# Patient Record
Sex: Male | Born: 1970 | Race: White | Hispanic: No | Marital: Married | Smoking: Former smoker
Health system: Southern US, Community
[De-identification: ages and names within clinical notes are randomized; demographics above are authoritative.]

## PROBLEM LIST (undated history)

## (undated) DIAGNOSIS — C4490 Unspecified malignant neoplasm of skin, unspecified: Secondary | ICD-10-CM

## (undated) DIAGNOSIS — Z9889 Other specified postprocedural states: Secondary | ICD-10-CM

## (undated) DIAGNOSIS — H9191 Unspecified hearing loss, right ear: Secondary | ICD-10-CM

## (undated) DIAGNOSIS — E7800 Pure hypercholesterolemia, unspecified: Secondary | ICD-10-CM

## (undated) DIAGNOSIS — R112 Nausea with vomiting, unspecified: Secondary | ICD-10-CM

## (undated) DIAGNOSIS — S82009A Unspecified fracture of unspecified patella, initial encounter for closed fracture: Secondary | ICD-10-CM

## (undated) DIAGNOSIS — K603 Anal fistula, unspecified: Secondary | ICD-10-CM

## (undated) DIAGNOSIS — Z8719 Personal history of other diseases of the digestive system: Secondary | ICD-10-CM

## (undated) DIAGNOSIS — N200 Calculus of kidney: Secondary | ICD-10-CM

## (undated) DIAGNOSIS — Z87898 Personal history of other specified conditions: Secondary | ICD-10-CM

## (undated) DIAGNOSIS — K219 Gastro-esophageal reflux disease without esophagitis: Secondary | ICD-10-CM

## (undated) DIAGNOSIS — K200 Eosinophilic esophagitis: Secondary | ICD-10-CM

## (undated) HISTORY — DX: Eosinophilic esophagitis: K20.0

## (undated) HISTORY — DX: Personal history of other diseases of the digestive system: Z87.19

## (undated) HISTORY — PX: OTHER SURGICAL HISTORY: SHX169

## (undated) HISTORY — PX: COLONOSCOPY: SHX174

## (undated) HISTORY — DX: Unspecified malignant neoplasm of skin, unspecified: C44.90

## (undated) HISTORY — PX: SHOULDER ARTHROSCOPY: SHX128

## (undated) HISTORY — DX: Pure hypercholesterolemia, unspecified: E78.00

## (undated) HISTORY — PX: TONSILLECTOMY: SUR1361

## (undated) HISTORY — PX: SHOULDER ARTHROTOMY: SUR111

## (undated) HISTORY — PX: LITHOTRIPSY: SUR834

---

## 2000-05-08 ENCOUNTER — Emergency Department (HOSPITAL_COMMUNITY): Admission: EM | Admit: 2000-05-08 | Discharge: 2000-05-08 | Payer: Self-pay | Admitting: Emergency Medicine

## 2000-05-08 ENCOUNTER — Encounter: Payer: Self-pay | Admitting: Emergency Medicine

## 2001-05-07 ENCOUNTER — Encounter: Admission: RE | Admit: 2001-05-07 | Discharge: 2001-05-07 | Payer: Self-pay | Admitting: Family Medicine

## 2001-05-07 ENCOUNTER — Encounter: Payer: Self-pay | Admitting: Family Medicine

## 2001-05-18 ENCOUNTER — Ambulatory Visit (HOSPITAL_COMMUNITY): Admission: RE | Admit: 2001-05-18 | Discharge: 2001-05-18 | Payer: Self-pay | Admitting: Gastroenterology

## 2001-05-18 ENCOUNTER — Encounter: Payer: Self-pay | Admitting: Gastroenterology

## 2004-01-12 ENCOUNTER — Emergency Department (HOSPITAL_COMMUNITY): Admission: EM | Admit: 2004-01-12 | Discharge: 2004-01-12 | Payer: Self-pay | Admitting: Emergency Medicine

## 2004-09-09 ENCOUNTER — Emergency Department (HOSPITAL_COMMUNITY): Admission: EM | Admit: 2004-09-09 | Discharge: 2004-09-09 | Payer: Self-pay | Admitting: Family Medicine

## 2005-09-15 ENCOUNTER — Emergency Department (HOSPITAL_COMMUNITY): Admission: EM | Admit: 2005-09-15 | Discharge: 2005-09-15 | Payer: Self-pay | Admitting: Family Medicine

## 2005-09-26 ENCOUNTER — Emergency Department (HOSPITAL_COMMUNITY): Admission: EM | Admit: 2005-09-26 | Discharge: 2005-09-26 | Payer: Self-pay | Admitting: Emergency Medicine

## 2005-10-21 ENCOUNTER — Emergency Department (HOSPITAL_COMMUNITY): Admission: EM | Admit: 2005-10-21 | Discharge: 2005-10-21 | Payer: Self-pay | Admitting: Family Medicine

## 2006-04-12 HISTORY — PX: MIDDLE EAR SURGERY: SHX713

## 2010-06-19 ENCOUNTER — Inpatient Hospital Stay (INDEPENDENT_AMBULATORY_CARE_PROVIDER_SITE_OTHER)
Admission: RE | Admit: 2010-06-19 | Discharge: 2010-06-19 | Disposition: A | Discharge status: Home / Self Care | Payer: BC Managed Care – PPO | Source: Ambulatory Visit | Attending: Emergency Medicine | Admitting: Emergency Medicine

## 2010-06-19 ENCOUNTER — Ambulatory Visit (HOSPITAL_COMMUNITY)
Admission: RE | Admit: 2010-06-19 | Discharge: 2010-06-19 | Disposition: A | Discharge status: Home / Self Care | Payer: BC Managed Care – PPO | Source: Ambulatory Visit | Attending: Family Medicine | Admitting: Family Medicine

## 2010-06-19 DIAGNOSIS — M25569 Pain in unspecified knee: Secondary | ICD-10-CM | POA: Insufficient documentation

## 2010-06-19 DIAGNOSIS — R609 Edema, unspecified: Secondary | ICD-10-CM | POA: Insufficient documentation

## 2010-06-19 DIAGNOSIS — M76899 Other specified enthesopathies of unspecified lower limb, excluding foot: Secondary | ICD-10-CM

## 2010-06-19 LAB — DIFFERENTIAL
Basophils Absolute: 0 10*3/uL (ref 0.0–0.1)
Basophils Relative: 0 % (ref 0–1)
Eosinophils Absolute: 0.5 10*3/uL (ref 0.0–0.7)
Eosinophils Relative: 5 % (ref 0–5)
Lymphocytes Relative: 30 % (ref 12–46)
Lymphs Abs: 2.7 10*3/uL (ref 0.7–4.0)
Monocytes Absolute: 0.9 10*3/uL (ref 0.1–1.0)
Monocytes Relative: 11 % (ref 3–12)
Neutro Abs: 4.9 10*3/uL (ref 1.7–7.7)
Neutrophils Relative %: 54 % (ref 43–77)

## 2010-06-19 LAB — CBC
Hemoglobin: 14.8 g/dL (ref 13.0–17.0)
MCHC: 34.7 g/dL (ref 30.0–36.0)
Platelets: 217 10*3/uL (ref 150–400)
RDW: 13.2 % (ref 11.5–15.5)

## 2011-07-04 ENCOUNTER — Encounter (HOSPITAL_COMMUNITY): Payer: Self-pay | Admitting: Pharmacy Technician

## 2011-07-11 ENCOUNTER — Encounter (HOSPITAL_COMMUNITY)
Admission: RE | Admit: 2011-07-11 | Discharge: 2011-07-11 | Disposition: A | Discharge status: Home / Self Care | Payer: BC Managed Care – PPO | Source: Ambulatory Visit | Attending: Otolaryngology | Admitting: Otolaryngology

## 2011-07-11 ENCOUNTER — Encounter (HOSPITAL_COMMUNITY): Payer: Self-pay

## 2011-07-11 HISTORY — DX: Nausea with vomiting, unspecified: R11.2

## 2011-07-11 HISTORY — DX: Other specified postprocedural states: Z98.890

## 2011-07-11 HISTORY — DX: Gastro-esophageal reflux disease without esophagitis: K21.9

## 2011-07-11 LAB — CBC
MCV: 91.2 fL (ref 78.0–100.0)
Platelets: 239 10*3/uL (ref 150–400)
RBC: 4.87 MIL/uL (ref 4.22–5.81)
WBC: 8.4 10*3/uL (ref 4.0–10.5)

## 2011-07-11 LAB — APTT: aPTT: 27 seconds (ref 24–37)

## 2011-07-11 LAB — DIFFERENTIAL
Basophils Absolute: 0 10*3/uL (ref 0.0–0.1)
Lymphocytes Relative: 29 % (ref 12–46)
Lymphs Abs: 2.5 10*3/uL (ref 0.7–4.0)
Neutro Abs: 4.7 10*3/uL (ref 1.7–7.7)
Neutrophils Relative %: 56 % (ref 43–77)

## 2011-07-11 LAB — SURGICAL PCR SCREEN
MRSA, PCR: NEGATIVE
Staphylococcus aureus: NEGATIVE

## 2011-07-11 LAB — PROTIME-INR
INR: 0.98 (ref 0.00–1.49)
Prothrombin Time: 13.2 seconds (ref 11.6–15.2)

## 2011-07-11 NOTE — Pre-Procedure Instructions (Signed)
20 Dean Hoover  07/11/2011   Your procedure is scheduled on:  March 7  Report to [facility] at 0730 AM.  Call this number if you have problems the morning of surgery: [phone]   Remember:   Do not eat food:After Midnight.  May have clear liquids: up to 4 Hours before arrival.330am  Clear liquids include soda, tea, black coffee, apple or grape juice, broth.  Take these medicines the morning of surgery with A SIP OF WATER: none   Do not wear jewelry, make-up or nail polish.  Do not wear lotions, powders, or perfumes. You may wear deodorant.  Do not shave 48 hours prior to surgery.  Do not bring valuables to the hospital.  Contacts, dentures or bridgework may not be worn into surgery.  Leave suitcase in the car. After surgery it may be brought to your room.  For patients admitted to the hospital, checkout time is 11:00 AM the day of discharge.   Patients discharged the day of surgery will not be allowed to drive home.  Name and phone number of your driver: family  Special Instructions: CHG Shower Use Special Wash: 1/2 bottle night before surgery and 1/2 bottle morning of surgery.   Please read over the following fact sheets that you were given: MRSA Information and Surgical Site Infection Prevention

## 2011-07-13 MED ORDER — CEFAZOLIN SODIUM-DEXTROSE 2-3 GM-% IV SOLR
2.0000 g | Freq: Once | INTRAVENOUS | Status: AC
  Administered 2011-07-14: 2 g via INTRAVENOUS
  Filled 2011-07-13: qty 50

## 2011-07-13 NOTE — H&P (Signed)
Dean Hoover is an 41 y.o. male.   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 PONV (postoperative nausea and vomiting)   . GERD (gastroesophageal reflux disease)     occ    Past Surgical History  Procedure Date  . Middle ear surgery 12,05,07    tumor removed, prosthestic hearing bones insert,  . Shoulder arthrotomy     torn lig lft  . Shoulder arthroscopy     torn cuff lft  . Tonsillectomy     41 yrs old    No family history on file. Social History:  reports that he has quit smoking. He does not have any smokeless tobacco history on file. He reports that he drinks about 3 ounces of alcohol per week. His drug history not on file.  Allergies:  Allergies  Allergen Reactions  . Fish Allergy Anaphylaxis    Throat swells  . Poultry Meal Anaphylaxis    Throat swells  . Tree Extract Hives  . Codeine Other (See Comments)    unknown    No current facility-administered medications on file as of .   No current outpatient prescriptions on file as of .    Results for orders placed during the hospital encounter of 07/11/11 (from the past 48 hour(s))  CBC     Status: Normal   Collection Time   07/11/11  2:26 PM      Component Value Range Comment   WBC 8.4  4.0 - 10.5 (K/uL)    RBC 4.87  4.22 - 5.81 (MIL/uL)    Hemoglobin 15.1  13.0 - 17.0 (g/dL)    HCT 16.1  09.6 - 04.5 (%)    MCV 91.2  78.0 - 100.0 (fL)    MCH 31.0  26.0 - 34.0 (pg)    MCHC 34.0  30.0 - 36.0 (g/dL)    RDW 40.9  81.1 - 91.4 (%)    Platelets 239  150 - 400 (K/uL)   DIFFERENTIAL     Status: Normal   Collection Time   07/11/11  2:26 PM      Component Value Range Comment   Neutrophils Relative 56  43 - 77 (%)    Neutro Abs 4.7  1.7 - 7.7 (K/uL)    Lymphocytes Relative 29  12 - 46 (%)    Lymphs Abs 2.5  0.7 - 4.0 (K/uL)    Monocytes Relative 11  3 - 12 (%)    Monocytes Absolute  0.9  0.1 - 1.0 (K/uL)    Eosinophils Relative 3  0 - 5 (%)    Eosinophils Absolute 0.3  0.0 - 0.7 (K/uL)    Basophils Relative 0  0 - 1 (%)    Basophils Absolute 0.0  0.0 - 0.1 (K/uL)   APTT     Status: Normal   Collection Time   07/11/11  2:26 PM      Component Value Range Comment   aPTT 27  24 - 37 (seconds)   PROTIME-INR     Status: Normal   Collection Time   07/11/11  2:26 PM      Component Value Range Comment   Prothrombin Time 13.2  11.6 - 15.2 (seconds)    INR 0.98  0.00 - 1.49    SURGICAL PCR SCREEN     Status: Normal   Collection Time   07/11/11  2:26 PM  Component Value Range Comment   MRSA, PCR NEGATIVE  NEGATIVE     Staphylococcus aureus NEGATIVE  NEGATIVE     No results found.  Review of Systems  Constitutional: Negative.   HENT: Positive for hearing loss.   Eyes: Negative.   Respiratory: Negative.   Cardiovascular: Negative.   Gastrointestinal: Negative.   Genitourinary: Negative.   Musculoskeletal: Negative.   Skin: Negative.   Neurological: Negative.   Endo/Heme/Allergies: Negative.   Psychiatric/Behavioral: Negative.     There were no vitals taken for this visit. Physical Exam  Constitutional: Vital signs are normal. He appears well-developed.  HENT:  Head: Normocephalic.  Right Ear: Decreased hearing is noted.  Left Ear: Hearing normal.  Nose: Nose normal.  Mouth/Throat: Uvula is midline, oropharynx is clear and moist and mucous membranes are normal.  Eyes: Pupils are equal, round, and reactive to light.  Neck: Normal range of motion. Neck supple.  Cardiovascular: Normal rate.   Respiratory: Effort normal and breath sounds normal.  GI: Soft.  Musculoskeletal: Normal range of motion.  Neurological: He is alert.  Skin: Skin is warm.     Assessment/Plan 1. 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 Patient is an excellent candidate for a Ponto hearing implant AD, Scheduled today, 1 hr. Gen  LMA anesthesia, OP. Risks, complications and alternatives have been discussed. Questions have been invited and answered, Informed consent has been signed and witnessed.  History & Physical Examination   Patient: Dean Hoover  Provider: Ermalinda Barrios, MD, MS, FACS  Date of Service:  06/08/2011  Location: [facility], [place]., [phone]    Provider: Ermalinda Barrios, MD, MS, FACS Encounter Date: Jun 08, 2011 Patient reexamined and H&P updated - July 14, 2011  Patient: Dean Hoover, Dean Hoover    ([id number]) Gender: Male       DOB: 03-17-1971      Age: 82 year 30 month       Race: White Address: [address],  [place]  [place]  [address]   Visit Type: Milt Coye, a 30 year 10 month white male, is here today for a return visit.  Complaint/HPI: The patient is here today for a discussion concerning a Baha or Ponto hearing implant for his right temporal bone. The patient has failed an ossiculoplasty due to lack of a right middle ear space. He has a stable right MRM cavity and K-Helix crown malleus to stapes reconstruction. However he has developed adhesive otitis media and his hearing remains decreased. He denies otorrhea, otalgia, tinnitus or vertigo.  Medical History: Ear Operations: . 1. 06-13-02 - modified radical mastoidectomy for cholesteatoma  2. 07-10-04 - revision ossiculoplasty for recurrent cholesteatoma  3. 05-24-10 - revision ossiculoplasty with K helix crown, malleus to stapes.  Family History: The patient's family history is noncontributory.  Social History: Smoking: His current smoking status is current every day smoker. Alcohol: Patient drinks alcoholic beverages. Marital Status: Patient is married. HIV status: (-) HIV status. Noise exposure: Noise exposure:. Recreational Drug Use: He denies recreational drug use.  Allergy: Opioids - Morphine Analogues    ROS: General: (-) fever, (-) chills, (-) night sweats, (-) fatigue, (-) weakness, (-) changes in  appetite or weight. (-) allergies, (-) not immunocompromised. Head: (-) headaches, (-) head injury or deformity. Eyes: (-) visual changes, (-) eye pain, (-) eye discharges, (-) redness, (-) itching, (-) excessive tearing, (-) double or blurred vision, (-) glaucoma, (-) cataracts. Ears: (+)  hearing loss. Nose and Sinuses: (-) frequent colds, (-) nasal stuffiness or itchiness, (-) postnasal drip, (-) hay fever, (-) nosebleeds, (-) sinus trouble. Mouth and Throat: (-) bleeding gums, (-) toothache, (-) odd taste sensations, (-) sores on tongue, (-) frequent sore throat, (-) hoarseness. Neck: (-) swollen glands, (-) enlarged thyroid, (-) neck pain. Cardiac: (-) chest pain, (-) edema, (-) high blood pressure, (-) irregular heartbeat, (-) orthopnea, (-) palpitations, (-) paroxysmal nocturnal dyspnea, (-) shortness of breath. Respiratory: (-) cough, (-) hemoptysis, (-) shortness of breath, (-) cyanosis, (-) wheezing, (-) nocturnal choking or gasping, (-) TB exposure. Gastrointestinal: (-) abdominal pain, (-) heartburn, (-) constipation, (-) diarrhea, (-) nausea, (-) vomiting, (-) hematochezia, (-) melena, (-) change in bowel habits. Urinary: (-) dysuria, (-) frequency, (-) urgency, (-) hesitancy, (-) polyuria, (-) nocturia, (-) hematuria, (-) urinary incontinence, (-) flank pain, (-) change in urinary habits. Gynecologic/Urologic: (-) genital sores or lesions, (-) history of STD, (-) sexual difficulties. Musculoskeletal: (-) muscle pain, (-) joint pain, (-) bone pain. Peripheral Vascular: (-) intermittent claudication, (-) cramps, (-) varicose veins, (-) thrombophlebitis. Neurological: (-) numbness, (-) tingling, (-) tremors, (-) seizures, (-) vertigo, (-) dizziness, (-) memory loss, (-) any focal or diffuse neurological deficits. Psychiatric: (-) anxiety, (-) depression, (-) sleep disturbance, (-) irritability, (-) mood swings, (-) suicidal thoughts or ideations. Endocrine: (-) heat or cold intolerance, (-)  excessive sweating, (-) diabetes, (-) excessive thirst, (-) excessive hunger, (-) excessive urination, (-) hirsutism, (-) change in ring or shoe size. Hematologic/Lymphatic: (-) anemia, (-) easy bruising, (-) excessive bleeding, (-) history of blood transfusions. Skin: (-) rashes, (-) lumps, (-) itching, (-) dryness, (-) acne, (-) discoloration, (-) recurrent skin infections, (-) changes in hair, nails or moles.  Vital Signs: Weight:   87.3 kgs Height:   6' BMI:   26.09 BP:   134/82  Examination: General Appearance: The patient is a well-developed, well-nourished, male, has no recognizable syndromes or patterns of malformation, and is in no acute distress. He is awake, alert, coherent, spontaneous, and logical. He is oriented to time, place, and person and communicates without difficulty.  Head: The patient's head was normocephalic and without any evidence of trauma or lesions.  Face: His facial motion was intact and symmetric bilaterally with normal resting facial tone and voluntary facial power.  Skin: Gross inspection of his facial skin demonstrated no evidence of abnormality.  Eyes: His pupils are equal, regular, reactive to light and accomodate (PERRLA). Extraocular movements were intact (EOMI). Connjunctivae were normal. There was no sclera icterus. There was no nystagmus. Eyelids appeared normal. There was no ptosis, lidlag, lid edema, or lagophthalmus.  External ears: Both of his external ears were normal in size, shape, angulation, and location.  External auditory canals: Examination of the external auditory canal revealed The patient has a stable right MRM cavity. I can see the K-Helix prosthesis in good position malleus to stapes. There is no tympanic membrane irritation but the tympanic membrane is draped over the prosthesis. The cavity was well epithelialized. His left tympanic membrane is stable.  Nose - external exam: External examination of the nose revealed a stable nasal  dorsum with normal support, normal skin, and patent nares. There were no deformities. Nose - internal exam: Anterior rhinoscopy revealed healthy, pink nasal septal and inferior/middle turbinate mucosa. The nasal septum was midline and without lesions or perforations. There was no bleeding noted. There were no polyps, lesions, masses or foreign bodies. His airway was patent bilaterally.  Oral Cavity: Examination of the  oral cavity revealed healthy moist mucosa, no evidence of lesions, ulcerations, erythema, edema, or leukoplakia. Gingiva and teeth were unremarkable. His lips, tongue and palates were normal. There were no lingual fasiculations. The oropharynx was symmetric and without lesions. The gag reflex was intact and symmetric.  Neck: Examination of his neck revealed full range of motion without pain. There were no significant palpable masses or cervical lymphadenopathy. There was normal laryngeal crepitus. The trachea was midline. His thyroid gland was not enlarged and did not have any palpable masses. There was no evidence of jugular venous distention. There were no audible carotid bruits.  Audiology Procedures: Audiogram/Graph Audiogram: I have reviewed the patient's audiogram. (EMK). Patient was found to have an SRT of 45 dB right ear with 100% discrimination. He had an SRT of 10 dB left ear with 100% discrimination.  Impression:  1. 45 DB hearing in the right ear status post ossiculoplasty in an MRM cavity AD.\  2. The patient would benefit from a Baha or Ponto hearing implant, right temporal bone. He wants to proceed with a Ponto implant. I had a long discussion with the patient and his wife concerning the advantages and disadvantages the Baha vs. the Ponto.  2. Conductive hearing loss right ear.      Status: stable. Medications: None required.  Recommendations: 1. The patient would benefit from a Ponto implant, right temporal bone, one hour, [facility], general  anesthesia, outpatient. Risks, competitions, and alternatives of the procedure were explained to the patient into his wife. Questions were invited and answered. Informed consent is to be signed and witnessed. Preop teaching and counseling were provided.  Time Consideration: Extra time was needed for the patient's visit due to answering all of the parent's questions providing detailed informed consent reviewing the details of the operative procedure.  Follow-Up: 1. Follow-up. 7 to 10 days postoperatively.  Diagnosis: 389.05      Conductive hearing loss - Unilateral  383.30      Postmastoidectomy Complication unspecified   Followup: Postop visit            Sarahelizabeth Conway M 07/13/2011, 8:53 AM

## 2011-07-14 ENCOUNTER — Encounter (HOSPITAL_COMMUNITY): Payer: Self-pay | Admitting: Anesthesiology

## 2011-07-14 ENCOUNTER — Ambulatory Visit (HOSPITAL_COMMUNITY): Payer: BC Managed Care – PPO | Admitting: Anesthesiology

## 2011-07-14 ENCOUNTER — Ambulatory Visit (HOSPITAL_COMMUNITY)
Admission: RE | Admit: 2011-07-14 | Discharge: 2011-07-14 | Disposition: A | Discharge status: Home / Self Care | Payer: BC Managed Care – PPO | Source: Ambulatory Visit | Attending: Otolaryngology | Admitting: Otolaryngology

## 2011-07-14 ENCOUNTER — Encounter (HOSPITAL_COMMUNITY)
Admission: RE | Disposition: A | Discharge status: Home / Self Care | Payer: Self-pay | Source: Ambulatory Visit | Attending: Otolaryngology

## 2011-07-14 DIAGNOSIS — Z01812 Encounter for preprocedural laboratory examination: Secondary | ICD-10-CM | POA: Insufficient documentation

## 2011-07-14 DIAGNOSIS — H908 Mixed conductive and sensorineural hearing loss, unspecified: Secondary | ICD-10-CM | POA: Diagnosis present

## 2011-07-14 HISTORY — PX: BONE ANCHORED HEARING AID IMPLANT: SHX5193

## 2011-07-14 SURGERY — INSERTION, BONE ANCHORED HEARING AID
Anesthesia: General | Site: Ear | Laterality: Right | Wound class: Clean Contaminated

## 2011-07-14 MED ORDER — ONDANSETRON HCL 4 MG/2ML IJ SOLN
4.0000 mg | Freq: Two times a day (BID) | INTRAMUSCULAR | Status: DC
  Filled 2011-07-14: qty 2

## 2011-07-14 MED ORDER — DEXAMETHASONE SODIUM PHOSPHATE 10 MG/ML IJ SOLN
10.0000 mg | Freq: Once | INTRAMUSCULAR | Status: DC
  Filled 2011-07-14: qty 1

## 2011-07-14 MED ORDER — LACTATED RINGERS IV SOLN
INTRAVENOUS | Status: DC
  Administered 2011-07-14: 09:00:00 via INTRAVENOUS

## 2011-07-14 MED ORDER — ACETAMINOPHEN 10 MG/ML IV SOLN
1000.0000 mg | Freq: Once | INTRAVENOUS | Status: AC
  Administered 2011-07-14: 1000 mg via INTRAVENOUS

## 2011-07-14 MED ORDER — SODIUM CHLORIDE 0.9 % IR SOLN
Status: DC | PRN
  Administered 2011-07-14: 10:00:00

## 2011-07-14 MED ORDER — LACTATED RINGERS IV SOLN
INTRAVENOUS | Status: DC | PRN
  Administered 2011-07-14: 09:00:00 via INTRAVENOUS

## 2011-07-14 MED ORDER — MIDAZOLAM HCL 5 MG/5ML IJ SOLN
INTRAMUSCULAR | Status: DC | PRN
  Administered 2011-07-14: 2 mg via INTRAVENOUS

## 2011-07-14 MED ORDER — BACITRACIN ZINC 500 UNIT/GM EX OINT
TOPICAL_OINTMENT | CUTANEOUS | Status: DC | PRN
  Administered 2011-07-14: 1 via TOPICAL

## 2011-07-14 MED ORDER — PROPOFOL 10 MG/ML IV EMUL
INTRAVENOUS | Status: DC | PRN
  Administered 2011-07-14: 200 mg via INTRAVENOUS

## 2011-07-14 MED ORDER — HYDROMORPHONE HCL PF 1 MG/ML IJ SOLN: 0.2500 mg | INTRAMUSCULAR | Status: DC | PRN

## 2011-07-14 MED ORDER — METHYLENE BLUE 1 % INJ SOLN
INTRAMUSCULAR | Status: DC | PRN
  Administered 2011-07-14: 1 mL

## 2011-07-14 MED ORDER — HYDROCODONE-ACETAMINOPHEN 5-300 MG PO TABS: 1.0000 | ORAL_TABLET | ORAL | Status: DC | PRN

## 2011-07-14 MED ORDER — FENTANYL CITRATE 0.05 MG/ML IJ SOLN
INTRAMUSCULAR | Status: DC | PRN
  Administered 2011-07-14: 25 ug via INTRAVENOUS
  Administered 2011-07-14: 50 ug via INTRAVENOUS

## 2011-07-14 MED ORDER — ONDANSETRON HCL 4 MG/2ML IJ SOLN
INTRAMUSCULAR | Status: DC | PRN
  Administered 2011-07-14: 4 mg via INTRAVENOUS

## 2011-07-14 MED ORDER — ACETAMINOPHEN 10 MG/ML IV SOLN
INTRAVENOUS | Status: AC
  Filled 2011-07-14: qty 100

## 2011-07-14 MED ORDER — CEFPROZIL 500 MG PO TABS: 500.0000 mg | ORAL_TABLET | Freq: Two times a day (BID) | ORAL | Status: AC

## 2011-07-14 MED ORDER — LIDOCAINE-EPINEPHRINE 1 %-1:100000 IJ SOLN
INTRAMUSCULAR | Status: DC | PRN
  Administered 2011-07-14: 3 mL

## 2011-07-14 MED ORDER — MINERAL OIL LIGHT 100 % EX OIL
TOPICAL_OIL | CUTANEOUS | Status: DC | PRN
  Administered 2011-07-14: 1 via TOPICAL

## 2011-07-14 MED ORDER — DEXAMETHASONE SODIUM PHOSPHATE 10 MG/ML IJ SOLN
INTRAMUSCULAR | Status: AC
  Administered 2011-07-14 (×2): 4 mg via INTRAVENOUS
  Filled 2011-07-14: qty 1

## 2011-07-14 MED ORDER — DROPERIDOL 2.5 MG/ML IJ SOLN
INTRAMUSCULAR | Status: DC | PRN
  Administered 2011-07-14: 0.625 mg via INTRAVENOUS

## 2011-07-14 SURGICAL SUPPLY — 62 items
BAG DECANTER FOR FLEXI CONT (MISCELLANEOUS) IMPLANT
BLADE SURG 15 STRL LF DISP TIS (BLADE) IMPLANT
BLADE SURG 15 STRL SS (BLADE)
BLADE SURG ROTATE 9660 (MISCELLANEOUS) ×2 IMPLANT
CANISTER SUCTION 2500CC (MISCELLANEOUS) ×2 IMPLANT
CAP HEALING (CAP) ×2 IMPLANT
CAP HEALING (MISCELLANEOUS) ×2 IMPLANT
CLOTH BEACON ORANGE TIMEOUT ST (SAFETY) ×2 IMPLANT
COTTONBALL LRG STERILE PKG (GAUZE/BANDAGES/DRESSINGS) ×2 IMPLANT
COUNTERSINK WIDE 4MM (OTIC EAR SUPPLIES) ×2 IMPLANT
COVER SURGICAL LIGHT HANDLE (MISCELLANEOUS) ×2 IMPLANT
DRAPE INCISE 23X17 IOBAN STRL (DRAPES) ×1
DRAPE INCISE IOBAN 23X17 STRL (DRAPES) ×1 IMPLANT
DRAPE SURG 17X11 SM STRL (DRAPES) ×6 IMPLANT
DRESSING ALLEVYN 5X5 FM NADH (MISCELLANEOUS) IMPLANT
DRESSING TELFA 8X3 (GAUZE/BANDAGES/DRESSINGS) IMPLANT
DRSG ALLEVYN 5X5 FM NADH (MISCELLANEOUS)
DRSG EMULSION OIL 3X3 NADH (GAUZE/BANDAGES/DRESSINGS) IMPLANT
DRSG GLASSCOCK MASTOID ADT (GAUZE/BANDAGES/DRESSINGS) IMPLANT
ELECT COATED BLADE 2.86 ST (ELECTRODE) ×2 IMPLANT
ELECT REM PT RETURN 9FT ADLT (ELECTROSURGICAL) ×2
ELECTRODE REM PT RTRN 9FT ADLT (ELECTROSURGICAL) ×1 IMPLANT
GAUZE PACKING IODOFORM 1/4X5 (PACKING) IMPLANT
GAUZE SPONGE 4X4 16PLY XRAY LF (GAUZE/BANDAGES/DRESSINGS) IMPLANT
GLOVE BIOGEL PI IND STRL 6.5 (GLOVE) ×2 IMPLANT
GLOVE BIOGEL PI INDICATOR 6.5 (GLOVE) ×2
GLOVE ECLIPSE 7.5 STRL STRAW (GLOVE) ×2 IMPLANT
GLOVE SURG SS PI 6.5 STRL IVOR (GLOVE) ×2 IMPLANT
GOWN STRL NON-REIN LRG LVL3 (GOWN DISPOSABLE) ×4 IMPLANT
GUIDE DRILL 3-4MM (OTIC EAR SUPPLIES) ×2 IMPLANT
IMPLANT WIDE 4MM (Miscellaneous) ×2 IMPLANT
KIT BAHA BLADE GUIDE DRILL (KITS) IMPLANT
KIT BASIN OR (CUSTOM PROCEDURE TRAY) ×2 IMPLANT
KIT PONTO AFTER CARE (KITS) ×2 IMPLANT
KIT ROOM TURNOVER OR (KITS) ×2 IMPLANT
NEEDLE 18GX1X1/2 (RX/OR ONLY) (NEEDLE) IMPLANT
NEEDLE HYPO 25X1 1.5 SAFETY (NEEDLE) ×2 IMPLANT
NS IRRIG 1000ML POUR BTL (IV SOLUTION) IMPLANT
PAD ARMBOARD 7.5X6 YLW CONV (MISCELLANEOUS) ×4 IMPLANT
PENCIL BUTTON HOLSTER BLD 10FT (ELECTRODE) ×2 IMPLANT
PROC SOUND POW PONTO RT DIA BL (OTIC EAR SUPPLIES) ×2 IMPLANT
PUNCH BIOPSY 4MM COCHLEAR (MISCELLANEOUS) ×2 IMPLANT
SPONGE SURGIFOAM ABS GEL 12-7 (HEMOSTASIS) IMPLANT
STOCKINETTE 6  STRL (DRAPES) ×1
STOCKINETTE 6 STRL (DRAPES) ×1 IMPLANT
SUT BONE WAX W31G (SUTURE) IMPLANT
SUT CHROMIC 3 0 PS 2 (SUTURE) IMPLANT
SUT CHROMIC 4 0 PS 2 18 (SUTURE) ×2 IMPLANT
SUT ETHILON 3 0 PS 1 (SUTURE) IMPLANT
SUT ETHILON 4 0 PS 2 18 (SUTURE) IMPLANT
SUT MNCRL AB 4-0 PS2 18 (SUTURE) IMPLANT
SUT VIC AB 3-0 PS2 18 (SUTURE) ×1
SUT VIC AB 3-0 PS2 18XBRD (SUTURE) ×1 IMPLANT
SUT VIC AB 4-0 P-3 18X BRD (SUTURE) IMPLANT
SUT VIC AB 4-0 P3 18 (SUTURE)
SYR BULB 3OZ (MISCELLANEOUS) IMPLANT
SYR TB 1ML LUER SLIP (SYRINGE) ×2 IMPLANT
TOWEL OR 17X24 6PK STRL BLUE (TOWEL DISPOSABLE) ×2 IMPLANT
TOWEL OR 17X26 10 PK STRL BLUE (TOWEL DISPOSABLE) ×4 IMPLANT
TRAY ENT MC OR (CUSTOM PROCEDURE TRAY) ×2 IMPLANT
WATER STERILE IRR 1000ML POUR (IV SOLUTION) IMPLANT
WIPE INSTRUMENT VISIWIPE 73X73 (MISCELLANEOUS) IMPLANT

## 2011-07-14 NOTE — Anesthesia Postprocedure Evaluation (Signed)
  Anesthesia Post-op Note  Patient: Dean Hoover  Procedure(s) Performed: Procedure(s) (LRB): BONE ANCHORED HEARING AID (BAHA) IMPLANT (Right)  Patient Location: PACU  Anesthesia Type: General  Level of Consciousness: awake  Airway and Oxygen Therapy: Patient Spontanous Breathing and Patient connected to nasal cannula oxygen  Post-op Pain: none  Post-op Assessment: Post-op Vital signs reviewed, Patient's Cardiovascular Status Stable, Respiratory Function Stable and Patent Airway  Post-op Vital Signs: Reviewed and stable  Complications: No apparent anesthesia complications

## 2011-07-14 NOTE — Brief Op Note (Signed)
07/14/2011  11:06 AM  PATIENT:  Angelique Holm Drummonds  41 y.o. male  PRE-OPERATIVE DIAGNOSIS:  CONDUCTIVE HEARING LOSS OF RIGHT EAR  POST-OPERATIVE DIAGNOSIS:  CONDUCTIVE HEARING LOSS OF RIGHT EAR  PROCEDURE:  Procedure(s) (LRB): BONE ANCHORED HEARING AID (BAHA) IMPLANT (Right)  SURGEON:  Surgeon(s) and Role:    * Carolan Shiver, MD - Primary  PHYSICIAN ASSISTANT:   ASSISTANTS: none   ANESTHESIA:   general  EBL:  Total I/O In: 800 [I.V.:800] Out: 10 [Blood:10]  BLOOD ADMINISTERED:none  DRAINS: none   LOCAL MEDICATIONS USED:  XYLOCAINE 3.0 ml   SPECIMEN:  No Specimen  DISPOSITION OF SPECIMEN:  N/A  COUNTS:  YES  TOURNIQUET:  * No tourniquets in log *  DICTATION: .Other Dictation: Dictation Number [id number]  PLAN OF CARE: Discharge to home after PACU  PATIENT DISPOSITION:  PACU - hemodynamically stable.   Delay start of Pharmacological VTE agent (>24hrs) due to surgical blood loss or risk of bleeding: not applicable

## 2011-07-14 NOTE — Anesthesia Procedure Notes (Signed)
Procedure Name: LMA Insertion Date/Time: 07/14/2011 10:24 AM Performed by: Elon Alas Pre-anesthesia Checklist: Patient identified, Timeout performed, Emergency Drugs available and Suction available Patient Re-evaluated:Patient Re-evaluated prior to inductionOxygen Delivery Method: Circle system utilized Preoxygenation: Pre-oxygenation with 100% oxygen Intubation Type: IV induction LMA: LMA inserted LMA Size: 5.0 Placement Confirmation: positive ETCO2 and breath sounds checked- equal and bilateral Tube secured with: Tape Dental Injury: Teeth and Oropharynx as per pre-operative assessment

## 2011-07-14 NOTE — Preoperative (Signed)
Beta Blockers   Reason not to administer Beta Blockers:Not Applicable 

## 2011-07-14 NOTE — Anesthesia Preprocedure Evaluation (Signed)
Anesthesia Evaluation  Patient identified by MRN, date of birth, ID band Patient awake    Reviewed: Allergy & Precautions, H&P , NPO status , Patient's Chart, lab work & pertinent test results  History of Anesthesia Complications (+) PONV  Airway Mallampati: II TM Distance: >3 FB Neck ROM: full    Dental No notable dental hx. (+) Teeth Intact   Pulmonary neg pulmonary ROS,  breath sounds clear to auscultation  Pulmonary exam normal       Cardiovascular negative cardio ROS  Rhythm:regular Rate:Normal     Neuro/Psych negative neurological ROS  negative psych ROS   GI/Hepatic Neg liver ROS, GERD-  Controlled,  Endo/Other  negative endocrine ROS  Renal/GU negative Renal ROS  negative genitourinary   Musculoskeletal   Abdominal   Peds  Hematology negative hematology ROS (+)   Anesthesia Other Findings   Reproductive/Obstetrics negative OB ROS                           Anesthesia Physical Anesthesia Plan  ASA: II  Anesthesia Plan: General   Post-op Pain Management:    Induction: Intravenous  Airway Management Planned: Oral ETT  Additional Equipment:   Intra-op Plan:   Post-operative Plan: Extubation in OR  Informed Consent: I have reviewed the patients History and Physical, chart, labs and discussed the procedure including the risks, benefits and alternatives for the proposed anesthesia with the patient or authorized representative who has indicated his/her understanding and acceptance.     Plan Discussed with: CRNA and Surgeon  Anesthesia Plan Comments:         Anesthesia Quick Evaluation

## 2011-07-14 NOTE — Transfer of Care (Signed)
Immediate Anesthesia Transfer of Care Note  Patient: Dean Hoover  Procedure(s) Performed: Procedure(s) (LRB): BONE ANCHORED HEARING AID (BAHA) IMPLANT (Right)  Patient Location: PACU  Anesthesia Type: General  Level of Consciousness: awake and alert   Airway & Oxygen Therapy: Patient Spontanous Breathing and Patient connected to nasal cannula oxygen  Post-op Assessment: Report given to PACU RN and Post -op Vital signs reviewed and stable  Post vital signs: Reviewed and stable  Complications: No apparent anesthesia complications

## 2011-07-14 NOTE — Discharge Instructions (Signed)
1. Elevate head of bed 30 degrees for the next 5 nights. 2. Keep right implant dry x 1 wk. 3. Follow your regular diet at home. 4. Apply neosporin ointment to incision line twice/day. 5. Leave healing cap applied to implant. 6. Call [phone] for any questions or problems related to your operation.   Instructions Following General Anesthetic, Adult A nurse specialized in giving anesthesia (anesthetist) or a doctor specialized in giving anesthesia (anesthesiologist) gave you a medicine that made you sleep while a procedure was performed. For as long as 24 hours following this procedure, you may feel:  Dizzy.   Weak.   Drowsy.  AFTER THE PROCEDURE After surgery, you will be taken to the recovery area where a nurse will monitor your progress. You will be allowed to go home when you are awake, stable, taking fluids well, and without complications. For the first 24 hours following an anesthetic:  Have a responsible person with you.   Do not drive a car. If you are alone, do not take public transportation.   Do not drink alcohol.   Do not take medicine that has not been prescribed by your caregiver.   Do not sign important papers or make important decisions.   You may resume normal diet and activities as directed.   Change bandages (dressings) as directed.   Only take over-the-counter or prescription medicines for pain, discomfort, or fever as directed by your caregiver.  If you have questions or problems that seem related to the anesthetic, call the hospital and ask for the anesthetist or anesthesiologist on call. SEEK IMMEDIATE MEDICAL CARE IF:   You develop a rash.   You have difficulty breathing.   You have chest pain.   You develop any allergic problems.  Document Released: 08/01/2000 Document Revised: 04/14/2011 Document Reviewed: 03/12/2007 [facility] Patient Information 2012 [place], [place].

## 2011-07-15 NOTE — Op Note (Signed)
NAMEMarland Kitchen  PEDROHENRIQUE, [place] NO.:  [id number]  MEDICAL RECORD NO.:  [id number]  LOCATION:  [facility]                         FACILITY:  [facility]  PHYSICIAN:  Carolan Shiver, M.D.    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  Dean Hoover has had a chronic history of right ear disease.  He had undergone a right modified radical mastoidectomy for excision of cholesteatoma.  He has undergone 3 failed tympanoplasties.  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  Because of this, he was recommended that he could do nothing, try to wear hearing aid in his right ear or proceed with a Ponto osteointegrated hearing implant.  He chose to proceed with the hearing implant.  Risks, complications and alternatives of the Ponto hearing implant were explained to him and to his wife.  Questions were invited and answered and informed consent was signed and witnessed.  Procedure was scheduled for July 14, 2011, at [facility] OR, general LMA anesthesia, 1 hour, outpatient.  Risks, complications, and alternatives again were explained to him and informed consent was signed and witnessed.  JUSTIFICATION FOR OUTPATIENT SETTING:  The patient's age, need for general LMA anesthesia.  JUSTIFICATION FOR OVERNIGHT STAY:  Not applicable.  PREOPERATIVE DIAGNOSIS:  Moderate mixed hearing loss, right ear status post modified radical mastoidectomy and 3 failed ossiculoplasties.  POSTOPERATIVE DIAGNOSIS:  Moderate mixed hearing loss, right ear status post modified radical mastoidectomy and 3 failed ossiculoplasties.  OPERATION:  Ponto osteointegrated hearing implant, right ear.  SURGEON: Carolan Shiver,  MD  ANESTHESIA:  General LMA, Sampson Goon.  COMPLICATIONS:  None.  DISCHARGE STATUS:  Stable.  SUMMARY OF REPORT:  After the patient was taken to the operating room, he was placed in the supine position.  An IV had been begun in the holding area.  General IV induction was then performed by Dr. Sampson Goon.  The patient was orally intubated with an LMA.  He was properly positioned and monitored.  Elbows and ankles padded with foam rubber and I initiated a time-out.  The patient was then turned 90 degrees counterclockwise and placed in a slightly head up position.  He was properly positioned and monitored on the operating room table.  Small amount of hair was clipped in the right postauricular area.  Hair was taped and stockinette cap was applied.  A dummy Baha sound processor was then positioned 55 mm posterior to the anterior ear canal, and leveled with the ascending helix.  A dot was created and then a vertical incision 1.5 cm superior to the dot and 2 cm inferior to the dot was then drawn.  The skin had previously been cleansed with chlorhexidine.  The site was then infiltrated with 3 mL of 1% Xylocaine with 1:100,000 epinephrine.  His hair was then taped and stockinette cap was applied.  His right ear and hemi  face were then prepped with Betadine and draped in a standard fashion for a Ponto implant.  Methylene blue was used to tattoo through the previously placed dot to the periosteum.  Then, a 4-mm dermatologic skin punch was then used to punch out the dot.  Incision was then made superior and inferior to the site and anterior and posterior flaps were elevated.  Fatty tissue was then removed circumferentially for 1 to 1.5 cm around the site and bleeding was controlled with unipolar cautery.  A cruciate incision was then made in the periosteum and my blue tattoo marking was identified.  Using a Baha handpiece and a 3-mm cutting bur, a hole was drilled in the skull to a  depth of 3 mm.  There was solid bone and the drill guide was removed and the hole was drilled to a 4 mm depth, again there was solid bone at the base.  A 4-mm countersink bur was then attached to the drill handpiece and at 2000 rpm's, a 4 mm hole was drilled and with a slight bevel countersink. Again, there was good bone at the base of the hole.  The site was then copiously irrigated with bacitracin containing saline, and, again, bleeding was controlled with unipolar cautery.  A 9-mm long titanium Ponto abutment with a 4-mm wide fixture was then attached to the drill.  The drill was set at 45 Newton cm..  Using continuous suction irrigation, the fixture was then implanted in the skull at 45 Newton cm of torque until the drill stopped.  The site was then copious irrigated again with bacitracin containing saline.  Bleeding was controlled with unipolar cautery.  The incision was closed in 2 layers using interrupted inverted 3-0 chromic for subcutaneous layer.  The skin was closed with interrupted 4-0 Ethilon.  Sutures around the abutment were placed from 2 o' clock to 10 o' clock and from 4 o' clock to 8 o' clock to cinch the skin down around the base of the abutment. Bacitracin ointment was applied.  A healing cap was then applied to the abutment and the fixture was wrapped with a quarter-inch iodoform Nu- Gauze impregnated with bacitracin ointment.  The patient was then awakened, extubated, and then transferred to his hospital bed.  He appeared to tolerate the general LMA anesthesia and the procedure well and left the operating room in stable condition.  TOTAL FLUIDS:  800 mL.  TOTAL BLOOD LOSS:  Less than 10 mL.  Sponge, needle, and cotton ball counts were correct at the termination of the procedure.  No specimens were sent to pathology.  The patient received Ancef 1 g IV, Zofran 4 mg IV at the beginning of the procedure, Decadron 10 mg IV and Ofirmev 1 g IV.  Dean Hoover  will be discharged today as an outpatient with his wife and parents.  They will be instructed to have him return to the office in 1 week for followup and suture removal,  will then return on August 25, 2011, at 1:00 p.m. for followup and loading of the abutment with Ponto sound processor.  Sound processor will then be programmed.  He is to follow a regular diet, keep his head elevated on 2 pillows for the next 3 evenings, avoid direct trauma, and call [phone] for any postoperative problems directly related to the procedure.  DISCHARGE MEDICATIONS:  Cefzil 500 mg p.o. b.i.d. x10 days with food, Vicodin 5/300, #30 1-2 p.o. q.4-6 h. p.r.n. pain, and his wife was told to apply  bacitracin ointment to the incision line b.i.d. for the next week.  She was also told to call [phone] for any postoperative problems directly related to the procedure.  SUMMARY:  The patient underwent an uncomplicated implantation of a Ponto osteointegrated hearing implant, right temporal bone, a 4-mm wide fixture with a 9-mm long abutment was implanted without difficulty. Sponge, needle, and cotton ball counts were correct at termination of the procedure.  There were no complications.     Carolan Shiver, M.D.     EMK/MEDQ  D:  07/14/2011  T:  07/15/2011  Job:  [id number]

## 2011-07-25 ENCOUNTER — Encounter (HOSPITAL_COMMUNITY): Payer: Self-pay | Admitting: Otolaryngology

## 2011-08-11 NOTE — OR Nursing (Signed)
Late Entry: Procedure End time documented and verified.

## 2012-12-20 ENCOUNTER — Encounter: Payer: Self-pay | Admitting: Family Medicine

## 2012-12-21 NOTE — Progress Notes (Signed)
This encounter was created in error - please disregard.

## 2013-12-20 ENCOUNTER — Telehealth: Payer: Self-pay | Admitting: Family Medicine

## 2013-12-20 NOTE — Telephone Encounter (Signed)
[phone]   Pt thinks he has developed a hemorrhoid and is wanting something called in  [facility]

## 2013-12-23 ENCOUNTER — Encounter (INDEPENDENT_AMBULATORY_CARE_PROVIDER_SITE_OTHER): Payer: 59 | Admitting: Surgery

## 2013-12-23 NOTE — Telephone Encounter (Signed)
NTBS before we can call anything in. Pt aware and states that it has already been taken care.

## 2014-03-19 ENCOUNTER — Emergency Department (HOSPITAL_COMMUNITY): Payer: 59

## 2014-03-19 ENCOUNTER — Encounter (HOSPITAL_COMMUNITY): Payer: Self-pay | Admitting: Emergency Medicine

## 2014-03-19 ENCOUNTER — Emergency Department (HOSPITAL_COMMUNITY)
Admission: EM | Admit: 2014-03-19 | Discharge: 2014-03-19 | Disposition: A | Discharge status: Home / Self Care | Payer: 59 | Attending: Emergency Medicine | Admitting: Emergency Medicine

## 2014-03-19 DIAGNOSIS — N200 Calculus of kidney: Secondary | ICD-10-CM | POA: Insufficient documentation

## 2014-03-19 DIAGNOSIS — R109 Unspecified abdominal pain: Secondary | ICD-10-CM | POA: Diagnosis present

## 2014-03-19 DIAGNOSIS — Z8719 Personal history of other diseases of the digestive system: Secondary | ICD-10-CM | POA: Insufficient documentation

## 2014-03-19 DIAGNOSIS — Z87891 Personal history of nicotine dependence: Secondary | ICD-10-CM | POA: Insufficient documentation

## 2014-03-19 LAB — COMPREHENSIVE METABOLIC PANEL
ALK PHOS: 62 U/L (ref 39–117)
ALT: 22 U/L (ref 0–53)
AST: 18 U/L (ref 0–37)
Albumin: 4.4 g/dL (ref 3.5–5.2)
Anion gap: 14 (ref 5–15)
BILIRUBIN TOTAL: 0.4 mg/dL (ref 0.3–1.2)
BUN: 11 mg/dL (ref 6–23)
CHLORIDE: 101 meq/L (ref 96–112)
CO2: 26 meq/L (ref 19–32)
Calcium: 10 mg/dL (ref 8.4–10.5)
Creatinine, Ser: 1.07 mg/dL (ref 0.50–1.35)
GFR, EST NON AFRICAN AMERICAN: 85 mL/min — AB (ref >90)
GLUCOSE: 137 mg/dL — AB (ref 70–99)
POTASSIUM: 4.3 meq/L (ref 3.7–5.3)
SODIUM: 141 meq/L (ref 137–147)
Total Protein: 8 g/dL (ref 6.0–8.3)

## 2014-03-19 LAB — URINALYSIS, ROUTINE W REFLEX MICROSCOPIC
BILIRUBIN URINE: NEGATIVE
Glucose, UA: NEGATIVE mg/dL
Ketones, ur: 15 mg/dL — AB
Leukocytes, UA: NEGATIVE
Nitrite: NEGATIVE
PH: 8 (ref 5.0–8.0)
Protein, ur: 30 mg/dL — AB
SPECIFIC GRAVITY, URINE: 1.025 (ref 1.005–1.030)
UROBILINOGEN UA: 0.2 mg/dL (ref 0.0–1.0)

## 2014-03-19 LAB — I-STAT TROPONIN, ED: TROPONIN I, POC: 0.01 ng/mL (ref 0.00–0.08)

## 2014-03-19 LAB — URINE MICROSCOPIC-ADD ON

## 2014-03-19 LAB — CBC WITH DIFFERENTIAL/PLATELET
BASOS ABS: 0 10*3/uL (ref 0.0–0.1)
Basophils Relative: 0 % (ref 0–1)
EOS PCT: 3 % (ref 0–5)
Eosinophils Absolute: 0.2 10*3/uL (ref 0.0–0.7)
HCT: 46.9 % (ref 39.0–52.0)
Hemoglobin: 16.4 g/dL (ref 13.0–17.0)
LYMPHS ABS: 3.4 10*3/uL (ref 0.7–4.0)
LYMPHS PCT: 47 % — AB (ref 12–46)
MCH: 31.3 pg (ref 26.0–34.0)
MCHC: 35 g/dL (ref 30.0–36.0)
MCV: 89.5 fL (ref 78.0–100.0)
Monocytes Absolute: 0.7 10*3/uL (ref 0.1–1.0)
Monocytes Relative: 10 % (ref 3–12)
NEUTROS ABS: 3 10*3/uL (ref 1.7–7.7)
NEUTROS PCT: 40 % — AB (ref 43–77)
PLATELETS: 231 10*3/uL (ref 150–400)
RBC: 5.24 MIL/uL (ref 4.22–5.81)
RDW: 13.2 % (ref 11.5–15.5)
WBC: 7.4 10*3/uL (ref 4.0–10.5)

## 2014-03-19 LAB — LIPASE, BLOOD: Lipase: 32 U/L (ref 11–59)

## 2014-03-19 MED ORDER — SODIUM CHLORIDE 0.9 % IV SOLN
1000.0000 mL | Freq: Once | INTRAVENOUS | Status: AC
  Administered 2014-03-19: 1000 mL via INTRAVENOUS

## 2014-03-19 MED ORDER — HYDROMORPHONE HCL 1 MG/ML IJ SOLN
1.0000 mg | Freq: Once | INTRAMUSCULAR | Status: AC
  Administered 2014-03-19: 1 mg via INTRAVENOUS
  Filled 2014-03-19: qty 1

## 2014-03-19 MED ORDER — ONDANSETRON HCL 4 MG/2ML IJ SOLN
4.0000 mg | Freq: Once | INTRAMUSCULAR | Status: AC
  Administered 2014-03-19: 4 mg via INTRAVENOUS
  Filled 2014-03-19: qty 2

## 2014-03-19 MED ORDER — ONDANSETRON 4 MG PO TBDP: ORAL_TABLET | ORAL | Status: DC

## 2014-03-19 MED ORDER — OXYCODONE-ACETAMINOPHEN 5-325 MG PO TABS: 1.0000 | ORAL_TABLET | Freq: Four times a day (QID) | ORAL | Status: DC | PRN

## 2014-03-19 MED ORDER — SODIUM CHLORIDE 0.9 % IV BOLUS (SEPSIS)
1000.0000 mL | Freq: Once | INTRAVENOUS | Status: AC
  Administered 2014-03-19: 1000 mL via INTRAVENOUS

## 2014-03-19 MED ORDER — MORPHINE SULFATE 4 MG/ML IJ SOLN
4.0000 mg | Freq: Once | INTRAMUSCULAR | Status: AC
  Administered 2014-03-19: 4 mg via INTRAVENOUS
  Filled 2014-03-19: qty 1

## 2014-03-19 MED ORDER — KETOROLAC TROMETHAMINE 30 MG/ML IJ SOLN
30.0000 mg | Freq: Once | INTRAMUSCULAR | Status: AC
  Administered 2014-03-19: 30 mg via INTRAVENOUS
  Filled 2014-03-19: qty 1

## 2014-03-19 MED ORDER — TAMSULOSIN HCL 0.4 MG PO CAPS: 0.4000 mg | ORAL_CAPSULE | Freq: Every day | ORAL | Status: DC

## 2014-03-19 MED ORDER — TAMSULOSIN HCL 0.4 MG PO CAPS
0.4000 mg | ORAL_CAPSULE | Freq: Once | ORAL | Status: AC
  Administered 2014-03-19: 0.4 mg via ORAL
  Filled 2014-03-19: qty 1

## 2014-03-19 MED ORDER — HYDROMORPHONE HCL 1 MG/ML IJ SOLN
1.0000 mg | Freq: Once | INTRAMUSCULAR | Status: DC
Start: 2014-03-19 — End: 2014-03-19

## 2014-03-19 NOTE — ED Provider Notes (Signed)
CSN: [id number]     Arrival date & time 03/19/14  [id number] History   First MD Initiated Contact with Patient 03/19/14 0719     Chief Complaint  Patient presents with  . Flank Pain  . Abdominal Pain    left side     (Consider location/radiation/quality/duration/timing/severity/associated sxs/prior Treatment) Patient is a 43 y.o. male presenting with flank pain and abdominal pain. The history is provided by the patient.  Flank Pain This is a new problem. The current episode started yesterday. Episode frequency: intermittent, worsening. The problem has been gradually worsening. Associated symptoms include abdominal pain. Pertinent negatives include no chest pain and no shortness of breath. Nothing aggravates the symptoms. Nothing relieves the symptoms.  Abdominal Pain Associated symptoms: nausea   Associated symptoms: no chest pain, no cough, no fever, no shortness of breath and no vomiting     Past Medical History  Diagnosis Date  . PONV (postoperative nausea and vomiting)   . GERD (gastroesophageal reflux disease)     occ   Past Surgical History  Procedure Laterality Date  . Middle ear surgery  12,05,07    tumor removed, prosthestic hearing bones insert,  . Shoulder arthrotomy      torn lig lft  . Shoulder arthroscopy      torn cuff lft  . Tonsillectomy      43 yrs old  . Bone anchored hearing aid implant  07/14/2011    Procedure: BONE ANCHORED HEARING AID (BAHA) IMPLANT;  Surgeon: Carolan ShiverEric M Kraus, MD;  [facility];  Service: ENT;  Laterality: Right;   No family history on file. History  Substance Use Topics  . Smoking status: Former Games developermoker  . Smokeless tobacco: Not on file     Comment: smoke once in while  . Alcohol Use: 3.0 oz/week    5 Cans of beer per week    Review of Systems  Constitutional: Negative for fever.  Respiratory: Negative for cough and shortness of breath.   Cardiovascular: Negative for chest pain.  Gastrointestinal: Positive for nausea and abdominal  pain. Negative for vomiting.  Genitourinary: Positive for flank pain.  All other systems reviewed and are negative.     Allergies  Fish allergy; Poultry meal; Tree extract; and Codeine  Home Medications   Prior to Admission medications   Medication Sig Start Date End Date Taking? Authorizing Provider  Hydrocodone-Acetaminophen 5-300 MG TABS Take 1-2 tablets by mouth every 4 (four) hours as needed. 07/14/11   Carolan ShiverEric M Kraus, MD   BP 140/91 mmHg  Pulse 86  Temp(Src) 98.4 F (36.9 C) (Oral)  Resp 17  Ht 6' (1.829 m)  Wt 190 lb (86.183 kg)  BMI 25.76 kg/m2  SpO2 100% Physical Exam  Constitutional: He is oriented to person, place, and time. He appears well-developed and well-nourished. No distress.  HENT:  Head: Normocephalic and atraumatic.  Mouth/Throat: Oropharynx is clear and moist. No oropharyngeal exudate.  Eyes: EOM are normal. Pupils are equal, round, and reactive to light.  Neck: Normal range of motion. Neck supple.  Cardiovascular: Normal rate and regular rhythm.  Exam reveals no friction rub.   No murmur heard. Pulmonary/Chest: Effort normal and breath sounds normal. No respiratory distress. He has no wheezes. He has no rales.  Abdominal: He exhibits no distension. There is tenderness (mild L CVA). There is no rebound.  Musculoskeletal: Normal range of motion. He exhibits no edema.  Neurological: He is alert and oriented to person, place, and time.  Skin: Skin is  warm. No rash noted. He is not diaphoretic.  Nursing note and vitals reviewed.   ED Course  Procedures (including critical care time) Labs Review Labs Reviewed  CBC WITH DIFFERENTIAL  COMPREHENSIVE METABOLIC PANEL  LIPASE, BLOOD  URINALYSIS, ROUTINE W REFLEX MICROSCOPIC  I-STAT TROPOININ, ED    Imaging Review Ct Renal Stone Study  03/19/2014   CLINICAL DATA:  Left-sided abdominal and flank pain with nausea and cloudy urine  EXAM: CT ABDOMEN AND PELVIS WITHOUT CONTRAST  TECHNIQUE: Multidetector CT  imaging of the abdomen and pelvis was performed following the standard protocol without oral or intravenous contrast material administration.  COMPARISON:  None.  FINDINGS: Lung bases are clear.  There is a small hiatal hernia.  No focal liver lesions are identified on this noncontrast enhanced study. Note that a small portion of the liver is not visualized. Gallbladder wall is not thickened. There is no biliary duct dilatation.  Spleen, pancreas, and adrenals appear normal.  Right kidney shows no evidence of mass, calculus, or hydronephrosis. There is no ureteral calculus on the right.  On the left, there is a 1 mm calculus in the mid kidney. There is no left renal mass. There is mild hydronephrosis on the left. There is a 3 mm calculus in the left ureter at the level of L3-4 causing mild hydronephrosis and proximal ureterectasis. Calculus is best seen on axial slice 44 series 201. No other ureteral calculus is apparent.  In the pelvis, the urinary bladder is midline with normal wall thickness. There is fat in each inguinal ring. There are several phleboliths in the left pelvis. There is no pelvic mass or fluid. There are scattered sigmoid diverticula without diverticulitis.  Appendix appears normal. Terminal ileum appears normal. There is a small ventral hernia containing only fat.  There is no bowel obstruction. There is no appreciable free air or portal venous air. Most of the small bowel loops are fluid-filled.  There is no ascites, adenopathy, or abscess in the abdomen or pelvis. There is no demonstrable abdominal aortic aneurysm. There are no blastic or lytic bone lesions. There is degenerative change in the lumbar spine.  IMPRESSION: 3 mm calculus in the proximal left ureter causing mild hydronephrosis and proximal ureterectasis. There is a small nonobstructing calculus in the mid left kidney as well.  Most small bowel loops are fluid-filled. This finding may be seen normally but also may represent early  ileus, possibly due to the presence of the ureteral calculus on the left.  No bowel obstruction. No abscess. Appendix appears normal. There is a small ventral hernia containing only fat. There is a small hiatal hernia.   Electronically Signed   By: Bretta BangWilliam  Woodruff M.D.   On: 03/19/2014 08:12     EKG Interpretation None      MDM   Final diagnoses:  Left sided abdominal pain  Kidney stone    68M here with L sided abdominal pain. Crampy, non radiating. Began last night, worse this morning. Some associated nausea/dark urine, but otherwise no fevers, no vomiting, no diarrhea. No hx of kidney stones, no hx of abdominal surgery.  On exam, mild L sided pain, mild L CVA tenderness. Pain meds given, will scan for stone.  Imaging shows stone. Feeling better with pain meds.  Given flomax, pain meds, stable for discharge.    I have reviewed all labs and imaging and considered them in my medical decision making.   Elwin MochaBlair Quinnley Colasurdo, MD 03/20/14 [phone]

## 2014-03-19 NOTE — Discharge Instructions (Signed)

## 2014-03-19 NOTE — ED Notes (Signed)
Patient transported to CT 

## 2014-03-19 NOTE — ED Notes (Signed)
Pt awoke with left sided abd and flank pain early this morning. Reports feeling a cramp yesterday but never got better. Nausea, cloudy urine, chills but denies fever. No hx of kidney stones. Pain 10/10 pain. A/O x4

## 2014-03-21 ENCOUNTER — Other Ambulatory Visit: Payer: Self-pay | Admitting: Urology

## 2014-03-21 ENCOUNTER — Encounter (HOSPITAL_COMMUNITY): Payer: Self-pay | Admitting: General Practice

## 2014-03-27 ENCOUNTER — Encounter (HOSPITAL_COMMUNITY): Payer: Self-pay | Admitting: *Deleted

## 2014-03-27 ENCOUNTER — Encounter (HOSPITAL_COMMUNITY)
Admission: RE | Disposition: A | Discharge status: Home / Self Care | Payer: Self-pay | Source: Ambulatory Visit | Attending: Urology

## 2014-03-27 ENCOUNTER — Ambulatory Visit (HOSPITAL_COMMUNITY): Payer: 59

## 2014-03-27 ENCOUNTER — Ambulatory Visit (HOSPITAL_COMMUNITY)
Admission: RE | Admit: 2014-03-27 | Discharge: 2014-03-27 | Disposition: A | Discharge status: Home / Self Care | Payer: 59 | Source: Ambulatory Visit | Attending: Urology | Admitting: Urology

## 2014-03-27 DIAGNOSIS — Z888 Allergy status to other drugs, medicaments and biological substances status: Secondary | ICD-10-CM | POA: Insufficient documentation

## 2014-03-27 DIAGNOSIS — K219 Gastro-esophageal reflux disease without esophagitis: Secondary | ICD-10-CM | POA: Insufficient documentation

## 2014-03-27 DIAGNOSIS — Z91018 Allergy to other foods: Secondary | ICD-10-CM | POA: Insufficient documentation

## 2014-03-27 DIAGNOSIS — N202 Calculus of kidney with calculus of ureter: Secondary | ICD-10-CM | POA: Diagnosis present

## 2014-03-27 DIAGNOSIS — Z87891 Personal history of nicotine dependence: Secondary | ICD-10-CM | POA: Diagnosis not present

## 2014-03-27 DIAGNOSIS — H908 Mixed conductive and sensorineural hearing loss, unspecified: Secondary | ICD-10-CM | POA: Insufficient documentation

## 2014-03-27 DIAGNOSIS — Z91013 Allergy to seafood: Secondary | ICD-10-CM | POA: Diagnosis not present

## 2014-03-27 DIAGNOSIS — N201 Calculus of ureter: Secondary | ICD-10-CM

## 2014-03-27 HISTORY — DX: Calculus of kidney: N20.0

## 2014-03-27 SURGERY — LITHOTRIPSY, ESWL
Anesthesia: LOCAL

## 2014-03-27 MED ORDER — DIAZEPAM 5 MG PO TABS
10.0000 mg | ORAL_TABLET | ORAL | Status: AC
  Administered 2014-03-27: 10 mg via ORAL
  Filled 2014-03-27: qty 2

## 2014-03-27 MED ORDER — DIPHENHYDRAMINE HCL 25 MG PO CAPS
25.0000 mg | ORAL_CAPSULE | ORAL | Status: AC
  Administered 2014-03-27: 25 mg via ORAL
  Filled 2014-03-27: qty 1

## 2014-03-27 MED ORDER — SODIUM CHLORIDE 0.9 % IV SOLN
INTRAVENOUS | Status: DC
  Administered 2014-03-27: 12:00:00 via INTRAVENOUS

## 2014-03-27 MED ORDER — OXYCODONE-ACETAMINOPHEN 5-325 MG PO TABS: 1.0000 | ORAL_TABLET | Freq: Four times a day (QID) | ORAL | Status: DC | PRN

## 2014-03-27 MED ORDER — GENTAMICIN SULFATE 40 MG/ML IJ SOLN
430.0000 mg | INTRAVENOUS | Status: AC
  Administered 2014-03-27: 430 mg via INTRAVENOUS
  Filled 2014-03-27: qty 10.75

## 2014-03-27 MED ORDER — GENTAMICIN IN SALINE 1.6-0.9 MG/ML-% IV SOLN: 80.0000 mg | INTRAVENOUS | Status: DC

## 2014-03-27 NOTE — Discharge Instructions (Signed)
1 - You may have urinary urgency (bladder spasms) and bloody urine on / off as well as pass some stone material for several days. This is normal.  2 - Call MD or go to ER for fever >102, severe pain / nausea / vomiting not relieved by medications, or acute change in medical status

## 2014-03-27 NOTE — H&P (Signed)
Dean Hoover B Hoover is an 43 y.o. male.    Chief Complaint: Pre-OP left Shockwave Lithotripsy  HPI:     1 - Nephrolithiasis - Left proximal 3mm stone with hydro (SD 11cm, 550HU, At L3-L4 interspace at level of transverese process) by ER CT 03/19/2014. Ipsilateral punctate renal, no additional stones. No prior colic episodes.  PMH sig for chochlear implant with revision x several, no CV disease, no strong blood thinners. He currently does not follow with PCP.  Today Dean Hoover is seen to proceed with left shockwave lithotripsy, denies interval stone passage on medical therapy. Goal is treatment prior [place] [employer] [place].  Past Medical History  Diagnosis Date  . PONV (postoperative nausea and vomiting)   . GERD (gastroesophageal reflux disease)     occ  . Kidney stone     Past Surgical History  Procedure Laterality Date  . Middle ear surgery  12,05,07    tumor removed, prosthestic hearing bones insert,  . Shoulder arthrotomy      torn lig lft  . Shoulder arthroscopy      torn cuff lft  . Tonsillectomy      43 yrs old  . Bone anchored hearing aid implant  07/14/2011    Procedure: BONE ANCHORED HEARING AID (BAHA) IMPLANT;  Surgeon: Carolan ShiverEric M Kraus, MD;  [facility];  Service: ENT;  Laterality: Right;    History reviewed. No pertinent family history. Social History:  reports that he has quit smoking. He does not have any smokeless tobacco history on file. He reports that he drinks about 3.0 oz of alcohol per week. His drug history is not on file.  Allergies:  Allergies  Allergen Reactions  . Fish Allergy Anaphylaxis    Throat swells  . Poultry Meal Anaphylaxis    Throat swells  . Tree Extract Hives    Tree nuts  . Codeine Other (See Comments)    unknown    No prescriptions prior to admission    No results found for this or any previous visit (from the past 48 hour(s)). No results found.  Review of Systems  Constitutional:  Negative.  Negative for fever and chills.  HENT: Negative.   Eyes: Negative.   Respiratory: Negative.   Cardiovascular: Negative.   Gastrointestinal: Negative.   Genitourinary: Positive for flank pain.  Musculoskeletal: Negative.   Skin: Negative.   Neurological: Negative.   Endo/Heme/Allergies: Negative.   Psychiatric/Behavioral: Negative.     Height 6' (1.829 m), weight 86.183 kg (190 lb). Physical Exam  Constitutional: He is oriented to person, place, and time. He appears well-developed and well-nourished.  HENT:  Head: Normocephalic and atraumatic.  Eyes: Pupils are equal, round, and reactive to light.  Neck: Normal range of motion.  Cardiovascular: Normal rate.   Respiratory: Effort normal.  GI: Soft.  Genitourinary:  Mild left CVAT  Musculoskeletal: Normal range of motion.  Neurological: He is alert and oriented to person, place, and time.  Skin: Skin is warm and dry.  Psychiatric: He has a normal mood and affect. His behavior is normal. Judgment and thought content normal.     Assessment/Plan   1 - Nephrolithiasis - small left ureteral stone that is visible on scout images. No interval passage. I strongly advised treatment and / or verify stone-free status BEFORE any air travel. I offered to write him letter to airline / hotel / etc if he wants to cancel his trip. He wants to proceed with SWL.  We rediscussed shockwave lithotripsy in detail as well as my "rule of 9s" with stones <329mm, less than 900 HU, and skin to stone distance <9cm having approximately 90% treatment success with single session of treatment. We then readdressed how stones that are larger, more dense, and in patients with less favorable anatomy have incrementally decreased success rates. We rediscussed risks including, bleeding, infection, hematoma, loss of kidney, need for staged therapy, need for adjunctive therapy and requirement to refrain from any anticoagulants, anti-platelet or aspirin-like products  peri-procedureally.   After careful consideration, the patient has chosen to proceed today as planned.  Dean Hoover 03/27/2014, 8:25 AM

## 2014-03-27 NOTE — Brief Op Note (Signed)
03/27/2014  1:33 PM  PATIENT:  Dean HolmKevin B Hoover  43 y.o. male  PRE-OPERATIVE DIAGNOSIS:  LEFT URETERAL STONE  POST-OPERATIVE DIAGNOSIS:  * No post-op diagnosis entered *  PROCEDURE:  Procedure(s): LEFT EXTRACORPOREAL SHOCK WAVE LITHOTRIPSY  (ESWL) (N/A)  SURGEON:  Surgeon(s) and Role:    * Sebastian Acheheodore Jedd Schulenburg, MD - Primary  PHYSICIAN ASSISTANT:   ASSISTANTS: none   ANESTHESIA:   IV sedation  EBL:     BLOOD ADMINISTERED:none  DRAINS: none   LOCAL MEDICATIONS USED:  NONE  SPECIMEN:  No Specimen  DISPOSITION OF SPECIMEN:  N/A  COUNTS:  YES  TOURNIQUET:  * No tourniquets in log *  DICTATION: .Other Dictation: Dictation Number Please see printed lithotripsy note for details  PLAN OF CARE: Discharge to home after PACU  PATIENT DISPOSITION:  PACU - hemodynamically stable.   Delay start of Pharmacological VTE agent (>24hrs) due to surgical blood loss or risk of bleeding: yes

## 2014-03-28 ENCOUNTER — Encounter (HOSPITAL_COMMUNITY): Payer: Self-pay | Admitting: Emergency Medicine

## 2014-03-28 ENCOUNTER — Emergency Department (HOSPITAL_COMMUNITY)
Admission: EM | Admit: 2014-03-28 | Discharge: 2014-03-28 | Disposition: A | Discharge status: Home / Self Care | Payer: 59 | Attending: Emergency Medicine | Admitting: Emergency Medicine

## 2014-03-28 DIAGNOSIS — N23 Unspecified renal colic: Secondary | ICD-10-CM | POA: Diagnosis not present

## 2014-03-28 DIAGNOSIS — Z87891 Personal history of nicotine dependence: Secondary | ICD-10-CM | POA: Diagnosis not present

## 2014-03-28 DIAGNOSIS — Z87442 Personal history of urinary calculi: Secondary | ICD-10-CM | POA: Diagnosis not present

## 2014-03-28 DIAGNOSIS — Z79899 Other long term (current) drug therapy: Secondary | ICD-10-CM | POA: Diagnosis not present

## 2014-03-28 DIAGNOSIS — R319 Hematuria, unspecified: Secondary | ICD-10-CM | POA: Diagnosis present

## 2014-03-28 DIAGNOSIS — Z8719 Personal history of other diseases of the digestive system: Secondary | ICD-10-CM | POA: Diagnosis not present

## 2014-03-28 LAB — URINALYSIS, ROUTINE W REFLEX MICROSCOPIC
Bilirubin Urine: NEGATIVE
Glucose, UA: NEGATIVE mg/dL
KETONES UR: NEGATIVE mg/dL
Leukocytes, UA: NEGATIVE
NITRITE: NEGATIVE
PROTEIN: NEGATIVE mg/dL
Specific Gravity, Urine: 1.016 (ref 1.005–1.030)
Urobilinogen, UA: 0.2 mg/dL (ref 0.0–1.0)
pH: 6.5 (ref 5.0–8.0)

## 2014-03-28 LAB — URINE MICROSCOPIC-ADD ON

## 2014-03-28 MED ORDER — HYDROMORPHONE HCL 1 MG/ML IJ SOLN
2.0000 mg | Freq: Once | INTRAMUSCULAR | Status: AC
  Administered 2014-03-28: 2 mg via INTRAVENOUS
  Filled 2014-03-28: qty 2

## 2014-03-28 MED ORDER — SODIUM CHLORIDE 0.9 % IV BOLUS (SEPSIS)
2000.0000 mL | Freq: Once | INTRAVENOUS | Status: AC
  Administered 2014-03-28: 2000 mL via INTRAVENOUS

## 2014-03-28 MED ORDER — KETOROLAC TROMETHAMINE 30 MG/ML IJ SOLN
30.0000 mg | Freq: Once | INTRAMUSCULAR | Status: AC
  Administered 2014-03-28: 30 mg via INTRAVENOUS
  Filled 2014-03-28: qty 1

## 2014-03-28 NOTE — ED Provider Notes (Addendum)
CSN: [id number]     Arrival date & time 03/28/14  0444 History   First MD Initiated Contact with Patient 03/28/14 0510     Chief Complaint  Patient presents with  . Flank Pain  . Hematuria     (Consider location/radiation/quality/duration/timing/severity/associated sxs/prior Treatment) HPI Complains of left flank pain onset 3 AM today following lithotripsy for left-sided ureteral stone yesterday. No treatment prior to coming here. Admits to nausea no vomiting. No other associated symptoms. Nothing makes symptoms better or worse. Pain is nonradiating and severe feels like prior kidney stone. Past Medical History  Diagnosis Date  . PONV (postoperative nausea and vomiting)   . GERD (gastroesophageal reflux disease)     occ  . Kidney stone    Past Surgical History  Procedure Laterality Date  . Middle ear surgery  12,05,07    tumor removed, prosthestic hearing bones insert,  . Shoulder arthrotomy      torn lig lft  . Shoulder arthroscopy      torn cuff lft  . Tonsillectomy      43 yrs old  . Bone anchored hearing aid implant  07/14/2011    Procedure: BONE ANCHORED HEARING AID (BAHA) IMPLANT;  Surgeon: Carolan ShiverEric M Kraus, MD;  [facility];  Service: ENT;  Laterality: Right;  . Lithotripsy     No family history on file. History  Substance Use Topics  . Smoking status: Former Games developermoker  . Smokeless tobacco: Not on file     Comment: smoke once in while  . Alcohol Use: 3.0 oz/week    5 Cans of beer per week    Review of Systems  Constitutional: Negative.   HENT: Negative.   Respiratory: Negative.   Cardiovascular: Negative.   Gastrointestinal: Positive for nausea.  Genitourinary: Positive for flank pain.  Musculoskeletal: Negative.   Skin: Negative.   Neurological: Negative.   Psychiatric/Behavioral: Negative.   All other systems reviewed and are negative.     Allergies  Fish allergy; Poultry meal; Tree extract; and Codeine  Home Medications   Prior to Admission  medications   Medication Sig Start Date End Date Taking? Authorizing Provider  ondansetron (ZOFRAN ODT) 4 MG disintegrating tablet 1 tab SL q4h PRN nausea/vomiting Patient taking differently: Take 4 mg by mouth every 8 (eight) hours as needed for nausea.  03/19/14  Yes Elwin MochaBlair Walden, MD  oxyCODONE-acetaminophen (PERCOCET) 5-325 MG per tablet Take 1-2 tablets by mouth every 6 (six) hours as needed for moderate pain. 03/27/14  Yes Sebastian Acheheodore Manny, MD  tamsulosin (FLOMAX) 0.4 MG CAPS capsule Take 1 capsule (0.4 mg total) by mouth daily. 03/19/14  Yes Elwin MochaBlair Walden, MD   BP 136/89 mmHg  Pulse 81  Temp(Src) 97.5 F (36.4 C) (Oral)  Resp 28  Ht 6' (1.829 m)  Wt 193 lb (87.544 kg)  BMI 26.17 kg/m2  SpO2 100% Physical Exam  Constitutional: He appears well-developed and well-nourished. He appears distressed.  Appears uncomfortable  HENT:  Head: Normocephalic and atraumatic.  Eyes: Conjunctivae are normal. Pupils are equal, round, and reactive to light.  Neck: Neck supple. No tracheal deviation present. No thyromegaly present.  Cardiovascular: Normal rate and regular rhythm.   No murmur heard. Pulmonary/Chest: Effort normal and breath sounds normal.  Abdominal: Soft. Bowel sounds are normal. He exhibits no distension. There is no tenderness.  Genitourinary:  No flank tenderness  Musculoskeletal: Normal range of motion. He exhibits no edema or tenderness.  Neurological: He is alert. Coordination normal.  Skin: Skin is warm  and dry. No rash noted.  Psychiatric: He has a normal mood and affect.  Nursing note and vitals reviewed.   ED Course  Procedures (including critical care time) Labs Review Labs Reviewed - No data to display  Imaging Review Dg Abd 1 View  03/27/2014   CLINICAL DATA:  Preop for left-sided kidney stone  EXAM: ABDOMEN - 1 VIEW  COMPARISON:  CT abdomen pelvis of 03/19/2014  FINDINGS: The 3 mm proximal left ureteral calculus noted by CT appears to have changed position,  and may well be within the lower left pelvis near the expected left UV junction. Several phleboliths are present just caudal to that region, but no calcific density is seen in in that region on the prior CT. A small left upper pole renal calculus is noted by plain film as well. The bowel gas pattern is nonspecific.  IMPRESSION: The left ureteral calculus noted by prior CT may have moved distally, probably being very near the left UV junction.   Electronically Signed   By: Dwyane Dee M.D.   On: 03/27/2014 14:51     EKG Interpretation None     5:40 AM pain improved after treatment with intravenous hydromorphone. Toradol ordered and intravenous fluids ordered.  6:10 AM Dr. Craige Cotta arrived to evaluate patient Results for orders placed or performed during the hospital encounter of 03/28/14  Urinalysis, Routine w reflex microscopic  Result Value Ref Range   Color, Urine YELLOW YELLOW   APPearance CLOUDY (A) CLEAR   Specific Gravity, Urine 1.016 1.005 - 1.030   pH 6.5 5.0 - 8.0   Glucose, UA NEGATIVE NEGATIVE mg/dL   Hgb urine dipstick LARGE (A) NEGATIVE   Bilirubin Urine NEGATIVE NEGATIVE   Ketones, ur NEGATIVE NEGATIVE mg/dL   Protein, ur NEGATIVE NEGATIVE mg/dL   Urobilinogen, UA 0.2 0.0 - 1.0 mg/dL   Nitrite NEGATIVE NEGATIVE   Leukocytes, UA NEGATIVE NEGATIVE  Urine microscopic-add on  Result Value Ref Range   WBC, UA 3-6 <3 WBC/hpf   RBC / HPF TOO NUMEROUS TO COUNT <3 RBC/hpf   Bacteria, UA RARE RARE   Casts HYALINE CASTS (A) NEGATIVE   Crystals CA OXALATE CRYSTALS (A) NEGATIVE   Dg Abd 1 View  03/27/2014   CLINICAL DATA:  Preop for left-sided kidney stone  EXAM: ABDOMEN - 1 VIEW  COMPARISON:  CT abdomen pelvis of 03/19/2014  FINDINGS: The 3 mm proximal left ureteral calculus noted by CT appears to have changed position, and may well be within the lower left pelvis near the expected left UV junction. Several phleboliths are present just caudal to that region, but no calcific density is  seen in in that region on the prior CT. A small left upper pole renal calculus is noted by plain film as well. The bowel gas pattern is nonspecific.  IMPRESSION: The left ureteral calculus noted by prior CT may have moved distally, probably being very near the left UV junction.   Electronically Signed   By: Dwyane Dee M.D.   On: 03/27/2014 14:51   Ct Renal Stone Study  03/19/2014   CLINICAL DATA:  Left-sided abdominal and flank pain with nausea and cloudy urine  EXAM: CT ABDOMEN AND PELVIS WITHOUT CONTRAST  TECHNIQUE: Multidetector CT imaging of the abdomen and pelvis was performed following the standard protocol without oral or intravenous contrast material administration.  COMPARISON:  None.  FINDINGS: Lung bases are clear.  There is a small hiatal hernia.  No focal liver lesions are identified on  this noncontrast enhanced study. Note that a small portion of the liver is not visualized. Gallbladder wall is not thickened. There is no biliary duct dilatation.  Spleen, pancreas, and adrenals appear normal.  Right kidney shows no evidence of mass, calculus, or hydronephrosis. There is no ureteral calculus on the right.  On the left, there is a 1 mm calculus in the mid kidney. There is no left renal mass. There is mild hydronephrosis on the left. There is a 3 mm calculus in the left ureter at the level of L3-4 causing mild hydronephrosis and proximal ureterectasis. Calculus is best seen on axial slice 44 series 201. No other ureteral calculus is apparent.  In the pelvis, the urinary bladder is midline with normal wall thickness. There is fat in each inguinal ring. There are several phleboliths in the left pelvis. There is no pelvic mass or fluid. There are scattered sigmoid diverticula without diverticulitis.  Appendix appears normal. Terminal ileum appears normal. There is a small ventral hernia containing only fat.  There is no bowel obstruction. There is no appreciable free air or portal venous air. Most of the  small bowel loops are fluid-filled.  There is no ascites, adenopathy, or abscess in the abdomen or pelvis. There is no demonstrable abdominal aortic aneurysm. There are no blastic or lytic bone lesions. There is degenerative change in the lumbar spine.  IMPRESSION: 3 mm calculus in the proximal left ureter causing mild hydronephrosis and proximal ureterectasis. There is a small nonobstructing calculus in the mid left kidney as well.  Most small bowel loops are fluid-filled. This finding may be seen normally but also may represent early ileus, possibly due to the presence of the ureteral calculus on the left.  No bowel obstruction. No abscess. Appendix appears normal. There is a small ventral hernia containing only fat. There is a small hiatal hernia.   Electronically Signed   By: Bretta BangWilliam  Woodruff M.D.   On: 03/19/2014 08:12    MDM  I spoke with Dr. Craige CottaKirby, urologist on-call who will come to evaluate patient  Final diagnoses:  None   Diagnosis ureteral colic     Doug SouSam Carletha Dawn, MD 03/28/14 0612 8:15 AM patient is resting comfortably. Pain is well-controlled. Dr. Craige CottaKirby contacted me by telephone suggests observation a patient here until 9 AM. If he is comfortable he can be discharged home with follow-up at urology office. Patient has opioid pain medicine at home. If pain is not well controlled he should receive noncontrasted CT scan and recontact urology. Pt signed out to Dr. Hyacinth MeekerMiller at 815 am  Doug SouSam Wilson Dusenbery, MD 03/28/14 [id number]  Doug SouSam Cartha Rotert, MD 03/28/14 [phone]

## 2014-03-28 NOTE — Discharge Instructions (Signed)
Kidney Stones Take your pain medicine as prescribed. If your pain is not well controlled, return to the emergency Department or contact [facility]. Keep any  appointments with the urologist that were previously scheduled Kidney stones (urolithiasis) are solid masses that form inside your kidneys. The intense pain is caused by the stone moving through the kidney, ureter, bladder, and urethra (urinary tract). When the stone moves, the ureter starts to spasm around the stone. The stone is usually passed in your pee (urine).  HOME CARE  Drink enough fluids to keep your pee clear or pale yellow. This helps to get the stone out.  Strain all pee through the provided strainer. Do not pee without peeing through the strainer, not even once. If you pee the stone out, catch it in the strainer. The stone may be as small as a grain of salt. Take this to your doctor. This will help your doctor figure out what you can do to try to prevent more kidney stones.  Only take medicine as told by your doctor.  Follow up with your doctor as told.  Get follow-up X-rays as told by your doctor. GET HELP IF: You have pain that gets worse even if you have been taking pain medicine. GET HELP RIGHT AWAY IF:   Your pain does not get better with medicine.  You have a fever or shaking chills.  Your pain increases and gets worse over 18 hours.  You have new belly (abdominal) pain.  You feel faint or pass out.  You are unable to pee. MAKE SURE YOU:   Understand these instructions.  Will watch your condition.  Will get help right away if you are not doing well or get worse. Document Released: 10/12/2007 Document Revised: 12/26/2012 Document Reviewed: 09/26/2012 [facility] Patient Information 2015 [place]. This information is not intended to replace advice given to you by your health care provider. Make sure you discuss any questions you have with your health care provider.

## 2014-03-28 NOTE — ED Provider Notes (Signed)
Patient reexamined at 9:00 AM, no pain whatsoever, has medications at home, stable for discharge.  Vida RollerBrian D Skyla Champagne, MD 03/28/14 0900

## 2014-03-28 NOTE — ED Notes (Addendum)
Pt. reports left flank pain with hematuria onset last week , lithotripsy done by Dr. Kathrynn RunningManning yesterday morning . No fever or chills.

## 2016-04-20 ENCOUNTER — Other Ambulatory Visit: Payer: Self-pay | Admitting: Surgery

## 2016-04-20 DIAGNOSIS — K611 Rectal abscess: Secondary | ICD-10-CM

## 2016-04-22 ENCOUNTER — Other Ambulatory Visit: Payer: Self-pay | Admitting: Surgery

## 2016-04-22 DIAGNOSIS — T1590XA Foreign body on external eye, part unspecified, unspecified eye, initial encounter: Secondary | ICD-10-CM

## 2016-05-04 ENCOUNTER — Ambulatory Visit
Admission: RE | Admit: 2016-05-04 | Discharge: 2016-05-04 | Disposition: A | Discharge status: Home / Self Care | Payer: 59 | Source: Ambulatory Visit | Attending: Surgery | Admitting: Surgery

## 2016-05-04 DIAGNOSIS — K611 Rectal abscess: Secondary | ICD-10-CM

## 2016-05-04 DIAGNOSIS — T1590XA Foreign body on external eye, part unspecified, unspecified eye, initial encounter: Secondary | ICD-10-CM

## 2016-05-04 MED ORDER — GADOBENATE DIMEGLUMINE 529 MG/ML IV SOLN
18.0000 mL | Freq: Once | INTRAVENOUS | Status: AC | PRN
  Administered 2016-05-04: 18 mL via INTRAVENOUS

## 2016-05-04 NOTE — Progress Notes (Signed)
I did not order this study.  Velora Hecklerodd M. Ellanor Feuerstein, MD, [facility] Office: [phone]

## 2016-07-11 ENCOUNTER — Other Ambulatory Visit: Payer: Self-pay | Admitting: General Surgery

## 2016-07-11 NOTE — H&P (Signed)
History of Present Illness Dean Hoover(Ante Arredondo MD; 07/11/2016 3:17 PM) The patient is a 46 year old male who presents with a complaint of anal problems. 46 year old male presents to the office for evaluation of a recurrent perirectal Hoover. He states that approximately 2 years ago he developed a posterior perianal Hoover that spontaneously drained on antibiotics. This continued to drain for approximately 3-4 weeks and then healed. He did not have any trouble with the area until recently. He underwent repeat I&D but continues to have drainage on a daily basis.    Problem List/Past Medical Dean Hoover(Elyanah Farino, MD; 07/11/2016 3:17 PM) Dean Hoover (K61.1) ANAL FISTULA (K60.3)  Past Surgical History Dean Hoover(Aarsh Fristoe, MD; 07/11/2016 3:17 PM) Oral Surgery Shoulder Surgery Left. Tonsillectomy  Diagnostic Studies History Dean Hoover(Arun Herrod, MD; 07/11/2016 3:17 PM) Colonoscopy never  Allergies Dean Hoover(Christen Lambert, RMA; 07/11/2016 3:00 PM) Codeine Phosphate *ANALGESICS - OPIOID*  Medication History Dean Hoover(Christen Lambert, RMA; 07/11/2016 3:00 PM) No Current Medications Medications Reconciled  Social History Dean Hoover(Nelta Caudill, MD; 07/11/2016 3:17 PM) Alcohol use Moderate alcohol use. Caffeine use Carbonated beverages, Coffee, Tea. No drug use  Family History Dean Hoover(Tyra Michelle, MD; 07/11/2016 3:17 PM) Alcohol Abuse Family Members In General. Arthritis Mother. Cerebrovascular Accident Family Members In General. Colon Cancer Family Members In General. Hypertension Family Members In General. Respiratory Condition Father. Seizure disorder Mother.  Other Problems Dean Hoover(Libby Goehring, MD; 07/11/2016 3:17 PM) Back Pain Gastric Ulcer Gastroesophageal Reflux Disease Heart murmur Kidney Stone     Review of Systems Dean Hoover(Greene Diodato MD; 07/11/2016 3:17 PM) Skin Present- Rash. Not Present- Change in Wart/Mole, Dryness, Hives, Jaundice, New Lesions, Non-Healing Wounds and Ulcer. HEENT Present- Hearing Loss.  Not Present- Earache, Hoarseness, Nose Bleed, Oral Ulcers, Ringing in the Ears, Seasonal Allergies, Sinus Pain, Sore Throat, Visual Disturbances, Wears glasses/contact lenses and Yellow Eyes. Respiratory Not Present- Bloody sputum, Chronic Cough, Difficulty Breathing, Snoring and Wheezing. Breast Not Present- Breast Mass, Breast Pain, Nipple Discharge and Skin Changes. Cardiovascular Not Present- Chest Pain, Difficulty Breathing Lying Down, Leg Cramps, Palpitations, Rapid Heart Rate, Shortness of Breath and Swelling of Extremities. Gastrointestinal Not Present- Abdominal Pain, Bloating, Bloody Stool, Change in Bowel Habits, Chronic diarrhea, Constipation, Difficulty Swallowing, Excessive gas, Gets full quickly at meals, Hemorrhoids, Indigestion, Nausea, Rectal Pain and Vomiting. Male Genitourinary Not Present- Blood in Urine, Change in Urinary Stream, Frequency, Impotence, Nocturia, Painful Urination, Urgency and Urine Leakage. Musculoskeletal Not Present- Back Pain, Joint Pain, Joint Stiffness, Muscle Pain, Muscle Weakness and Swelling of Extremities. Neurological Not Present- Decreased Memory, Fainting, Headaches, Numbness, Seizures, Tingling, Tremor, Trouble walking and Weakness. Psychiatric Not Present- Anxiety, Bipolar, Change in Sleep Pattern, Depression, Fearful and Frequent crying. Endocrine Not Present- Cold Intolerance, Excessive Hunger, Hair Changes, Heat Intolerance and New Diabetes. Hematology Not Present- Blood Thinners, Easy Bruising, Excessive bleeding, Gland problems, HIV and Persistent Infections.  Vitals Dean Hoover(Christen Lambert RMA; 07/11/2016 3:00 PM) 07/11/2016 3:00 PM Weight: 205.2 lb Height: 72in Body Surface Area: 2.15 m Body Mass Index: 27.83 kg/m  Temp.: 98.48F  Pulse: 83 (Regular)  BP: 130/90 (Sitting, Left Arm, Standard)      Physical Exam Dean Hoover(Roosevelt Eimers MD; 07/11/2016 3:17 PM)  General Mental Status-Alert. General Appearance-Not in acute distress. Build  & Nutrition-Well nourished. Posture-Normal posture. Gait-Normal.  Head and Neck Head-normocephalic, atraumatic with no lesions or palpable masses. Trachea-midline.  Chest and Lung Exam Chest and lung exam reveals -on auscultation, normal breath sounds, no adventitious sounds and normal vocal resonance.  Cardiovascular Cardiovascular examination reveals -normal heart sounds, regular rate and rhythm with no  murmurs.  Abdomen Inspection Inspection of the abdomen reveals - No Hernias. Palpation/Percussion Palpation and Percussion of the abdomen reveal - Soft, Non Tender, No Rigidity (guarding), No hepatosplenomegaly and No Palpable abdominal masses.  Rectal Anorectal Exam External - Note: Posterior midline defect with periodic drainage. Fistula probe inserted traverses towards anal canal superficially.  Neurologic Neurologic evaluation reveals -alert and oriented x 3 with no impairment of recent or remote memory, normal attention span and ability to concentrate, normal sensation and normal coordination.  Musculoskeletal Normal Exam - Bilateral-Upper Extremity Strength Normal and Lower Extremity Strength Normal.    Assessment & Plan Dean Levee MD; 07/11/2016 3:15 PM)  ANAL FISTULA (K60.3) Impression: 46 year old male with a fistula noted on physical exam and MRI. This has not improved with time. I recommended an exam under anesthesia with possible fistulotomy versus seton placement. We discussed the risk and benefits of each procedure. We discussed the risk of delayed healing with fistulotomy as well as possible incontinence rates. We discussed the need for second procedure for seton is placed. We discussed typical out of work times being 1-3 weeks.

## 2016-09-14 ENCOUNTER — Encounter (HOSPITAL_BASED_OUTPATIENT_CLINIC_OR_DEPARTMENT_OTHER): Payer: Self-pay | Admitting: *Deleted

## 2016-09-14 NOTE — Progress Notes (Signed)
Pt instructed npo pmn 5/17 to include no dipping p mn .  Pt verbalized his understanding.  [facility].  Needs hgb on arrival.

## 2016-09-23 ENCOUNTER — Ambulatory Visit (HOSPITAL_BASED_OUTPATIENT_CLINIC_OR_DEPARTMENT_OTHER): Payer: 59 | Admitting: Certified Registered"

## 2016-09-23 ENCOUNTER — Encounter (HOSPITAL_BASED_OUTPATIENT_CLINIC_OR_DEPARTMENT_OTHER)
Admission: RE | Disposition: A | Discharge status: Home / Self Care | Payer: Self-pay | Source: Ambulatory Visit | Attending: General Surgery

## 2016-09-23 ENCOUNTER — Ambulatory Visit (HOSPITAL_BASED_OUTPATIENT_CLINIC_OR_DEPARTMENT_OTHER)
Admission: RE | Admit: 2016-09-23 | Discharge: 2016-09-23 | Disposition: A | Discharge status: Home / Self Care | Payer: 59 | Source: Ambulatory Visit | Attending: General Surgery | Admitting: General Surgery

## 2016-09-23 ENCOUNTER — Encounter (HOSPITAL_BASED_OUTPATIENT_CLINIC_OR_DEPARTMENT_OTHER): Payer: Self-pay

## 2016-09-23 DIAGNOSIS — Z885 Allergy status to narcotic agent status: Secondary | ICD-10-CM | POA: Insufficient documentation

## 2016-09-23 DIAGNOSIS — K603 Anal fistula: Secondary | ICD-10-CM | POA: Insufficient documentation

## 2016-09-23 DIAGNOSIS — Z79899 Other long term (current) drug therapy: Secondary | ICD-10-CM | POA: Insufficient documentation

## 2016-09-23 DIAGNOSIS — Z87891 Personal history of nicotine dependence: Secondary | ICD-10-CM | POA: Diagnosis not present

## 2016-09-23 DIAGNOSIS — Z8711 Personal history of peptic ulcer disease: Secondary | ICD-10-CM | POA: Diagnosis not present

## 2016-09-23 HISTORY — DX: Anal fistula, unspecified: K60.30

## 2016-09-23 HISTORY — DX: Personal history of other diseases of the digestive system: Z87.19

## 2016-09-23 HISTORY — DX: Anal fistula: K60.3

## 2016-09-23 HISTORY — DX: Unspecified fracture of unspecified patella, initial encounter for closed fracture: S82.009A

## 2016-09-23 HISTORY — DX: Unspecified hearing loss, right ear: H91.91

## 2016-09-23 HISTORY — DX: Personal history of other specified conditions: Z87.898

## 2016-09-23 LAB — POCT HEMOGLOBIN-HEMACUE: HEMOGLOBIN: 16.4 g/dL (ref 13.0–17.0)

## 2016-09-23 SURGERY — EXAM UNDER ANESTHESIA
Anesthesia: Monitor Anesthesia Care

## 2016-09-23 MED ORDER — OXYCODONE HCL 5 MG PO TABS: 5.0000 mg | ORAL_TABLET | ORAL | 0 refills | Status: DC | PRN

## 2016-09-23 MED ORDER — ACETAMINOPHEN 650 MG RE SUPP
650.0000 mg | RECTAL | Status: DC | PRN
  Filled 2016-09-23: qty 1

## 2016-09-23 MED ORDER — LACTATED RINGERS IV SOLN
INTRAVENOUS | Status: DC
  Administered 2016-09-23: 07:00:00 via INTRAVENOUS
  Filled 2016-09-23: qty 1000

## 2016-09-23 MED ORDER — PROPOFOL 10 MG/ML IV BOLUS
INTRAVENOUS | Status: AC
  Filled 2016-09-23: qty 20

## 2016-09-23 MED ORDER — SODIUM CHLORIDE 0.9% FLUSH
3.0000 mL | Freq: Two times a day (BID) | INTRAVENOUS | Status: DC
  Filled 2016-09-23: qty 3

## 2016-09-23 MED ORDER — MIDAZOLAM HCL 2 MG/2ML IJ SOLN
INTRAMUSCULAR | Status: AC
  Filled 2016-09-23: qty 2

## 2016-09-23 MED ORDER — DEXAMETHASONE SODIUM PHOSPHATE 4 MG/ML IJ SOLN
INTRAMUSCULAR | Status: DC | PRN
  Administered 2016-09-23: 10 mg via INTRAVENOUS

## 2016-09-23 MED ORDER — ACETAMINOPHEN 325 MG PO TABS
650.0000 mg | ORAL_TABLET | ORAL | Status: DC | PRN
  Filled 2016-09-23: qty 2

## 2016-09-23 MED ORDER — LIDOCAINE 5 % EX OINT
TOPICAL_OINTMENT | CUTANEOUS | Status: DC | PRN
  Administered 2016-09-23: 1

## 2016-09-23 MED ORDER — ONDANSETRON HCL 4 MG/2ML IJ SOLN
INTRAMUSCULAR | Status: DC | PRN
  Administered 2016-09-23: 4 mg via INTRAVENOUS

## 2016-09-23 MED ORDER — SODIUM CHLORIDE 0.9% FLUSH
3.0000 mL | INTRAVENOUS | Status: DC | PRN
  Filled 2016-09-23: qty 3

## 2016-09-23 MED ORDER — DEXAMETHASONE SODIUM PHOSPHATE 10 MG/ML IJ SOLN
INTRAMUSCULAR | Status: AC
  Filled 2016-09-23: qty 1

## 2016-09-23 MED ORDER — ONDANSETRON HCL 4 MG/2ML IJ SOLN
4.0000 mg | Freq: Once | INTRAMUSCULAR | Status: DC | PRN
  Filled 2016-09-23: qty 2

## 2016-09-23 MED ORDER — SODIUM CHLORIDE 0.9 % IV SOLN
250.0000 mL | INTRAVENOUS | Status: DC | PRN
  Filled 2016-09-23: qty 250

## 2016-09-23 MED ORDER — BUPIVACAINE-EPINEPHRINE 0.5% -1:200000 IJ SOLN
INTRAMUSCULAR | Status: DC | PRN
  Administered 2016-09-23: 30 mL

## 2016-09-23 MED ORDER — LIDOCAINE 2% (20 MG/ML) 5 ML SYRINGE
INTRAMUSCULAR | Status: DC | PRN
  Administered 2016-09-23: 60 mg via INTRAVENOUS

## 2016-09-23 MED ORDER — FENTANYL CITRATE (PF) 100 MCG/2ML IJ SOLN
INTRAMUSCULAR | Status: AC
  Filled 2016-09-23: qty 2

## 2016-09-23 MED ORDER — OXYCODONE HCL 5 MG PO TABS: 5.0000 mg | ORAL_TABLET | ORAL | 0 refills | Status: AC | PRN

## 2016-09-23 MED ORDER — PROPOFOL 10 MG/ML IV BOLUS
INTRAVENOUS | Status: DC | PRN
  Administered 2016-09-23: 30 mg via INTRAVENOUS

## 2016-09-23 MED ORDER — LIDOCAINE 2% (20 MG/ML) 5 ML SYRINGE
INTRAMUSCULAR | Status: AC
  Filled 2016-09-23: qty 5

## 2016-09-23 MED ORDER — PROPOFOL 500 MG/50ML IV EMUL
INTRAVENOUS | Status: AC
  Filled 2016-09-23: qty 50

## 2016-09-23 MED ORDER — FENTANYL CITRATE (PF) 100 MCG/2ML IJ SOLN
INTRAMUSCULAR | Status: DC | PRN
  Administered 2016-09-23: 25 ug via INTRAVENOUS
  Administered 2016-09-23: 50 ug via INTRAVENOUS

## 2016-09-23 MED ORDER — MEPERIDINE HCL 25 MG/ML IJ SOLN
6.2500 mg | INTRAMUSCULAR | Status: DC | PRN
  Filled 2016-09-23: qty 1

## 2016-09-23 MED ORDER — MIDAZOLAM HCL 5 MG/5ML IJ SOLN
INTRAMUSCULAR | Status: DC | PRN
  Administered 2016-09-23: 2 mg via INTRAVENOUS

## 2016-09-23 MED ORDER — OXYCODONE HCL 5 MG PO TABS
5.0000 mg | ORAL_TABLET | ORAL | Status: DC | PRN
  Filled 2016-09-23: qty 2

## 2016-09-23 MED ORDER — PROPOFOL 500 MG/50ML IV EMUL
INTRAVENOUS | Status: DC | PRN
  Administered 2016-09-23: 200 ug/kg/min via INTRAVENOUS

## 2016-09-23 MED ORDER — ONDANSETRON HCL 4 MG/2ML IJ SOLN
INTRAMUSCULAR | Status: AC
  Filled 2016-09-23: qty 2

## 2016-09-23 MED ORDER — HYDROMORPHONE HCL 1 MG/ML IJ SOLN
0.2500 mg | INTRAMUSCULAR | Status: DC | PRN
  Filled 2016-09-23: qty 0.5

## 2016-09-23 SURGICAL SUPPLY — 50 items
BENZOIN TINCTURE PRP APPL 2/3 (GAUZE/BANDAGES/DRESSINGS) ×3 IMPLANT
BLADE HEX COATED 2.75 (ELECTRODE) ×3 IMPLANT
BLADE SURG 10 STRL SS (BLADE) IMPLANT
BLADE SURG 15 STRL LF DISP TIS (BLADE) ×1 IMPLANT
BLADE SURG 15 STRL SS (BLADE) ×2
BRIEF STRETCH FOR OB PAD LRG (UNDERPADS AND DIAPERS) ×3 IMPLANT
CANISTER SUCT 3000ML PPV (MISCELLANEOUS) ×3 IMPLANT
COVER BACK TABLE 60X90IN (DRAPES) ×3 IMPLANT
COVER MAYO STAND STRL (DRAPES) ×3 IMPLANT
DECANTER SPIKE VIAL GLASS SM (MISCELLANEOUS) IMPLANT
DRAPE LAPAROTOMY 100X72 PEDS (DRAPES) IMPLANT
DRAPE UTILITY XL STRL (DRAPES) ×3 IMPLANT
DRSG PAD ABDOMINAL 8X10 ST (GAUZE/BANDAGES/DRESSINGS) ×3 IMPLANT
ELECT BLADE 6.5 .24CM SHAFT (ELECTRODE) IMPLANT
ELECT REM PT RETURN 9FT ADLT (ELECTROSURGICAL) ×3
ELECTRODE REM PT RTRN 9FT ADLT (ELECTROSURGICAL) ×1 IMPLANT
GAUZE SPONGE 4X4 12PLY STRL LF (GAUZE/BANDAGES/DRESSINGS) ×3 IMPLANT
GAUZE SPONGE 4X4 16PLY XRAY LF (GAUZE/BANDAGES/DRESSINGS) IMPLANT
GLOVE BIO SURGEON STRL SZ 6.5 (GLOVE) ×2 IMPLANT
GLOVE BIO SURGEONS STRL SZ 6.5 (GLOVE) ×1
GLOVE INDICATOR 7.0 STRL GRN (GLOVE) ×3 IMPLANT
GOWN SPEC L3 XXLG W/TWL (GOWN DISPOSABLE) ×3 IMPLANT
GOWN STRL REUS W/TWL LRG LVL3 (GOWN DISPOSABLE) IMPLANT
GOWN STRL REUS W/TWL XL LVL3 (GOWN DISPOSABLE) IMPLANT
HEMOSTAT SURGICEL 2X14 (HEMOSTASIS) IMPLANT
IV CATH 14GX2 1/4 (CATHETERS) IMPLANT
IV CATH 18G SAFETY (IV SOLUTION) ×3 IMPLANT
KIT RM TURNOVER CYSTO AR (KITS) ×3 IMPLANT
LOOP VESSEL MAXI BLUE (MISCELLANEOUS) IMPLANT
NEEDLE HYPO 22GX1.5 SAFETY (NEEDLE) ×3 IMPLANT
NS IRRIG 500ML POUR BTL (IV SOLUTION) ×3 IMPLANT
PACK BASIN DAY SURGERY FS (CUSTOM PROCEDURE TRAY) ×3 IMPLANT
PAD ABD 8X10 STRL (GAUZE/BANDAGES/DRESSINGS) IMPLANT
PAD ARMBOARD 7.5X6 YLW CONV (MISCELLANEOUS) ×3 IMPLANT
PENCIL BUTTON HOLSTER BLD 10FT (ELECTRODE) ×3 IMPLANT
SPONGE HEMORRHOID 8X3CM (HEMOSTASIS) IMPLANT
SPONGE SURGIFOAM ABS GEL 100 (HEMOSTASIS) IMPLANT
SPONGE SURGIFOAM ABS GEL 12-7 (HEMOSTASIS) IMPLANT
SUT CHROMIC 2 0 SH (SUTURE) ×3 IMPLANT
SUT CHROMIC 3 0 SH 27 (SUTURE) IMPLANT
SUT ETHIBOND 0 (SUTURE) IMPLANT
SUT VIC AB 2-0 SH 27 (SUTURE)
SUT VIC AB 2-0 SH 27XBRD (SUTURE) IMPLANT
SYR CONTROL 10ML LL (SYRINGE) ×3 IMPLANT
SYRINGE 10CC LL (SYRINGE) IMPLANT
TOWEL OR 17X24 6PK STRL BLUE (TOWEL DISPOSABLE) ×6 IMPLANT
TRAY DSU PREP LF (CUSTOM PROCEDURE TRAY) ×3 IMPLANT
TUBE CONNECTING 12'X1/4 (SUCTIONS) ×1
TUBE CONNECTING 12X1/4 (SUCTIONS) ×2 IMPLANT
YANKAUER SUCT BULB TIP NO VENT (SUCTIONS) ×3 IMPLANT

## 2016-09-23 NOTE — Discharge Instructions (Addendum)

## 2016-09-23 NOTE — H&P (Signed)
The patient is a 46 year old male who presents with a complaint of anal problems. 46 year old male presents to the office for evaluation of a recurrent perirectal abscess. He states that approximately 2 years ago he developed a posterior perianal abscess that spontaneously drained on antibiotics. This continued to drain for approximately 3-4 weeks and then healed. He did not have any trouble with the area until recently. He underwent repeat I&D but continues to have drainage on a daily basis.    Problem List/Past Medical Romie Levee, MD; 07/11/2016 3:17 PM) Ocie Cornfield ABSCESS (K61.1) ANAL FISTULA (K60.3)  Past Surgical History Romie Levee, MD; 07/11/2016 3:17 PM) Oral Surgery Shoulder Surgery Left. Tonsillectomy  Diagnostic Studies History Romie Levee, MD; 07/11/2016 3:17 PM) Colonoscopy never  Allergies Christianne Dolin, RMA; 07/11/2016 3:00 PM) Codeine Phosphate *ANALGESICS - OPIOID*  Medication History Christianne Dolin, RMA; 07/11/2016 3:00 PM) No Current Medications Medications Reconciled  Social History Romie Levee, MD; 07/11/2016 3:17 PM) Alcohol use Moderate alcohol use. Caffeine use Carbonated beverages, Coffee, Tea. No drug use  Family History Romie Levee, MD; 07/11/2016 3:17 PM) Alcohol Abuse Family Members In General. Arthritis Mother. Cerebrovascular Accident Family Members In General. Colon Cancer Family Members In General. Hypertension Family Members In General. Respiratory Condition Father. Seizure disorder Mother.  Other Problems Romie Levee, MD; 07/11/2016 3:17 PM) Back Pain Gastric Ulcer Gastroesophageal Reflux Disease Heart murmur Kidney Stone     Review of Systems  Skin Present- Rash. Not Present- Change in Wart/Mole, Dryness, Hives, Jaundice, New Lesions, Non-Healing Wounds and Ulcer. HEENT Present- Hearing Loss. Not Present- Earache, Hoarseness, Nose Bleed, Oral Ulcers, Ringing in the Ears, Seasonal  Allergies, Sinus Pain, Sore Throat, Visual Disturbances, Wears glasses/contact lenses and Yellow Eyes. Respiratory Not Present- Bloody sputum, Chronic Cough, Difficulty Breathing, Snoring and Wheezing. Breast Not Present- Breast Mass, Breast Pain, Nipple Discharge and Skin Changes. Cardiovascular Not Present- Chest Pain, Difficulty Breathing Lying Down, Leg Cramps, Palpitations, Rapid Heart Rate, Shortness of Breath and Swelling of Extremities. Gastrointestinal Not Present- Abdominal Pain, Bloating, Bloody Stool, Change in Bowel Habits, Chronic diarrhea, Constipation, Difficulty Swallowing, Excessive gas, Gets full quickly at meals, Hemorrhoids, Indigestion, Nausea, Rectal Pain and Vomiting. Male Genitourinary Not Present- Blood in Urine, Change in Urinary Stream, Frequency, Impotence, Nocturia, Painful Urination, Urgency and Urine Leakage. Musculoskeletal Not Present- Back Pain, Joint Pain, Joint Stiffness, Muscle Pain, Muscle Weakness and Swelling of Extremities. Neurological Not Present- Decreased Memory, Fainting, Headaches, Numbness, Seizures, Tingling, Tremor, Trouble walking and Weakness. Psychiatric Not Present- Anxiety, Bipolar, Change in Sleep Pattern, Depression, Fearful and Frequent crying. Endocrine Not Present- Cold Intolerance, Excessive Hunger, Hair Changes, Heat Intolerance and New Diabetes. Hematology Not Present- Blood Thinners, Easy Bruising, Excessive bleeding, Gland problems, HIV and Persistent Infections.  BP 139/87   Pulse 66   Temp 98.4 F (36.9 C) (Oral)   Resp 16   Ht 6' (1.829 m)   Wt 88.9 kg (196 lb)   SpO2 100%   BMI 26.58 kg/m    Physical Exam   General Mental Status-Alert. General Appearance-Not in acute distress. Build & Nutrition-Well nourished. Posture-Normal posture. Gait-Normal.  Head and Neck Head-normocephalic, atraumatic with no lesions or palpable masses. Trachea-midline.  Chest and Lung Exam Chest and lung exam reveals  -on auscultation, normal breath sounds, no adventitious sounds and normal vocal resonance.  Cardiovascular Cardiovascular examination reveals -normal heart sounds, regular rate and rhythm with no murmurs.  Abdomen Inspection Inspection of the abdomen reveals - No Hernias. Palpation/Percussion Palpation and Percussion of the abdomen reveal -  Soft, Non Tender, No Rigidity (guarding), No hepatosplenomegaly and No Palpable abdominal masses.  Rectal Anorectal Exam External - Note: Posterior midline defect with periodic drainage. Fistula probe inserted traverses towards anal canal superficially.  Neurologic Neurologic evaluation reveals -alert and oriented x 3 with no impairment of recent or remote memory, normal attention span and ability to concentrate, normal sensation and normal coordination.  Musculoskeletal Normal Exam - Bilateral-Upper Extremity Strength Normal and Lower Extremity Strength Normal.    Assessment & Plan   ANAL FISTULA (K60.3) Impression: 46 year old male with a fistula noted on physical exam and MRI. This has not improved with time. I recommended an exam under anesthesia with possible fistulotomy versus seton placement. We discussed the risk and benefits of each procedure. We discussed the risk of delayed healing with fistulotomy as well as possible incontinence rates. We discussed the need for second procedure for seton is placed. We discussed typical out of work times being 1-3 weeks.

## 2016-09-23 NOTE — Anesthesia Postprocedure Evaluation (Signed)
Anesthesia Post Note  Patient: Darry Kelnhofer Moynahan  Procedure(s) Performed: Procedure(s) (LRB): EXAM UNDER ANESTHESIA, FISTULOTOMY (N/A)  Patient location during evaluation: PACU Anesthesia Type: MAC Level of consciousness: awake and alert Pain management: pain level controlled Vital Signs Assessment: post-procedure vital signs reviewed and stable Respiratory status: spontaneous breathing, nonlabored ventilation, respiratory function stable and patient connected to nasal cannula oxygen Cardiovascular status: stable and blood pressure returned to baseline Anesthetic complications: no       Last Vitals:  Vitals:   09/23/16 0830 09/23/16 0845  BP: (!) 144/77 134/83  Pulse: 81 84  Resp: 16 (!) 26  Temp:      Last Pain:  Vitals:   09/23/16 0815  TempSrc:   PainSc: [place] DAVID

## 2016-09-23 NOTE — Op Note (Signed)
09/23/2016  7:46 AM  PATIENT:  Dean Hoover  46 y.o. male  Patient Care Team: Patient, No Pcp Per as PCP - General (General Practice)  PRE-OPERATIVE DIAGNOSIS:  Anal Fistula  POST-OPERATIVE DIAGNOSIS:  Anal Fistula  PROCEDURE:  Procedure(s): EXAM UNDER ANESTHESIA, FISTULOTOMY    Surgeon(s): Leighton Ruff, MD  ASSISTANT: none   ANESTHESIA:   local and MAC  SPECIMEN:  No Specimen  DISPOSITION OF SPECIMEN:  N/A  COUNTS:  YES  PLAN OF CARE: Discharge to home after PACU  PATIENT DISPOSITION:  PACU - hemodynamically stable.  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   OR FINDINGS: Posterior midline radial fistula involving the distal portion of the internal sphincter   DESCRIPTION: the patient was identified in the preoperative holding area and taken to the OR where they were laid on the operating room table.  MAC anesthesia was induced without difficulty. The patient was then positioned in prone jackknife position with buttocks gently taped apart.  The patient was then prepped and draped in usual sterile fashion.  SCDs were noted to be in place prior to the initiation of anesthesia. A surgical timeout was performed indicating the correct patient, procedure, positioning and need for preoperative antibiotics.  A rectal block was performed using Marcaine with epinephrine.    I began with a digital rectal exam.  There were no masses noted.  I then placed a Hill-Ferguson anoscope into the anal canal and evaluated this completely.  The internal exam was normal. I placed an S shaped fistula probe into the posterior midline wound. This exited just distal to the dentate line. It appeared to only involve the distal portion of his internal sphincter. I decided to perform a fistulotomy. This was done using electrocautery. The edges were marsupialized using a 2-0 chromic suture. The patient tolerated the procedure well. A sterile dressing was applied and he  was sent to the postanesthesia care unit in stable condition. All counts were correct per operating room staff.

## 2016-09-23 NOTE — Transfer of Care (Signed)
Immediate Anesthesia Transfer of Care Note  Patient: Dean Hoover  Procedure(s) Performed: Procedure(s) (LRB): EXAM UNDER ANESTHESIA, FISTULOTOMY (N/A)  Patient Location: PACU  Anesthesia Type: MAC  Level of Consciousness: awake, alert , oriented and patient cooperative  Airway & Oxygen Therapy: Patient Spontanous Breathing and Patient connected to face mask oxygen  Post-op Assessment: Report given to PACU RN and Post -op Vital signs reviewed and stable  Post vital signs: Reviewed and stable  Complications: No apparent anesthesia complications Last Vitals:  Vitals:   09/23/16 0616 09/23/16 0756  BP: 139/87 (P) 137/68  Pulse: 66   Resp: 16 (P) 20  Temp: 36.9 C (P) 36.6 C    Last Pain:  Vitals:   09/23/16 0616  TempSrc: Oral

## 2016-09-23 NOTE — Anesthesia Preprocedure Evaluation (Signed)
Anesthesia Evaluation  Patient identified by MRN, date of birth, ID band Patient awake    Reviewed: Allergy & Precautions, NPO status , Patient's Chart, lab work & pertinent test results  History of Anesthesia Complications (+) PONV  Airway Mallampati: I  TM Distance: >3 FB Neck ROM: Full    Dental   Pulmonary former smoker,    Pulmonary exam normal        Cardiovascular Normal cardiovascular exam     Neuro/Psych    GI/Hepatic GERD  Medicated and Controlled,  Endo/Other    Renal/GU      Musculoskeletal   Abdominal   Peds  Hematology   Anesthesia Other Findings   Reproductive/Obstetrics                             Anesthesia Physical Anesthesia Plan  ASA: II  Anesthesia Plan: MAC   Post-op Pain Management:    Induction: Intravenous  Airway Management Planned: Simple Face Mask  Additional Equipment:   Intra-op Plan:   Post-operative Plan:   Informed Consent: I have reviewed the patients History and Physical, chart, labs and discussed the procedure including the risks, benefits and alternatives for the proposed anesthesia with the patient or authorized representative who has indicated his/her understanding and acceptance.     Plan Discussed with: CRNA and Surgeon  Anesthesia Plan Comments:         Anesthesia Quick Evaluation

## 2017-05-04 IMAGING — MR MR PELVIS WO/W CM
7 of 8 series · 39 of 48 positions shown · IV contrast (multihance)
Comparison: 03/19/2014 unenhanced CT abdomen/ pelvis.

CLINICAL DATA: 45-year-old male with reported history of
intermittent perirectal abscess and clinical concern for perirectal
fistula.

EXAM:
MRI PELVIS WITHOUT AND WITH CONTRAST
TECHNIQUE: Multiplanar multisequence MR imaging of the pelvis was performed
both before and after administration of intravenous contrast.
CONTRAST:  18mL MULTIHANCE GADOBENATE DIMEGLUMINE 529 MG/ML IV SOLN

[Series 3: t2_tse_sag · sagittal · 2.5mm · 0.81mm/px · 7 of 50 slices shown]
[im 1/50]
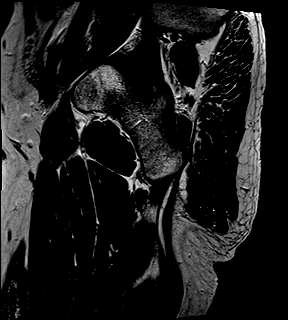
[im 9/50]
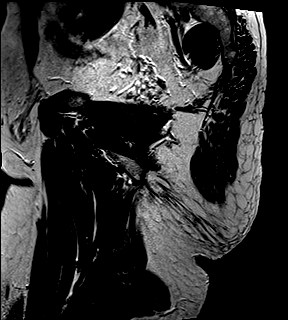
[im 17/50]
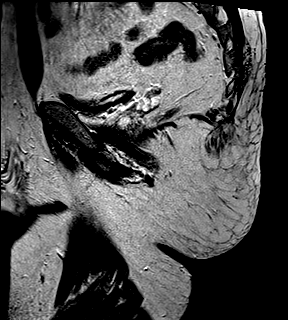
[im 25/50]
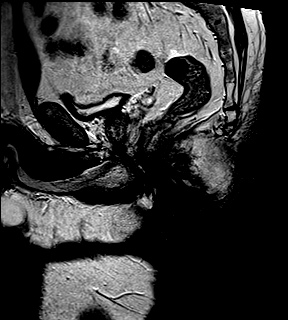
[im 33/50]
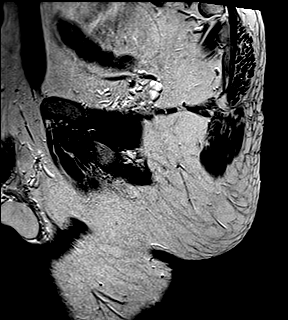
[im 41/50]
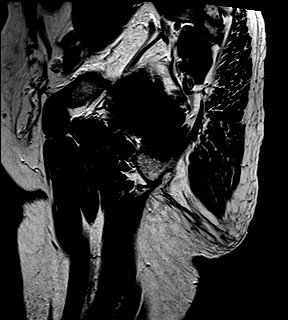
[im 50/50]
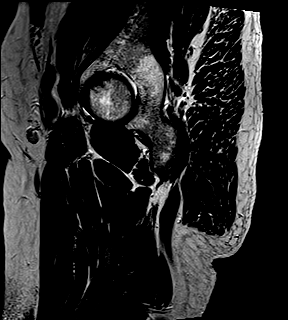

[Series 4: t2_tse_sag fs · sagittal · 2.5mm · 0.81mm/px · 7 of 50 slices shown]
[im 1/50]
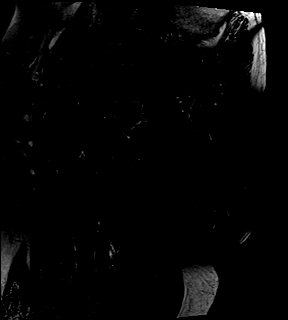
[im 9/50]
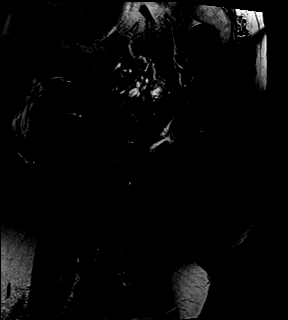
[im 17/50]
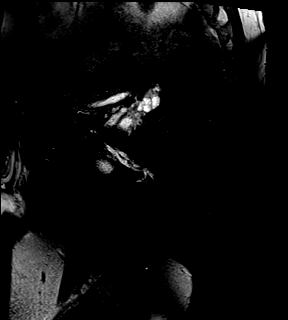
[im 25/50]
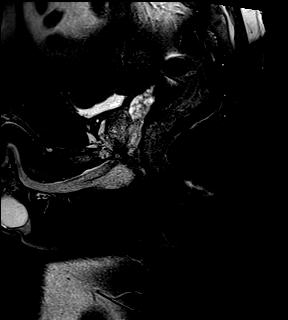
[im 33/50]
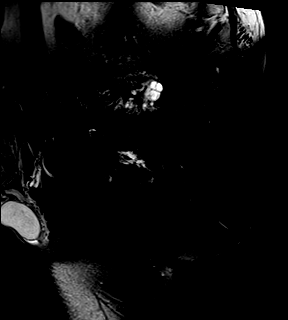
[im 41/50]
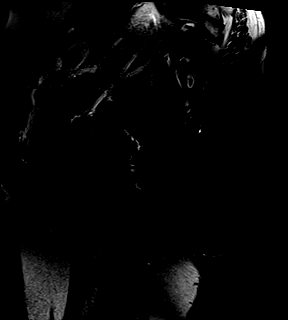
[im 50/50]
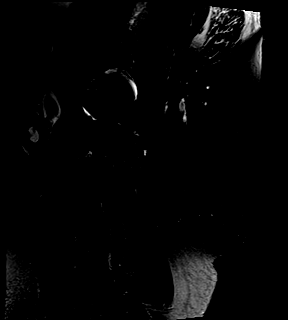

[Series 5: t1_tse axial obl · axial · 4.0mm · 0.57mm/px · z∈[-125,+30]mm · 6 of 36 slices shown]
[im 1/36]
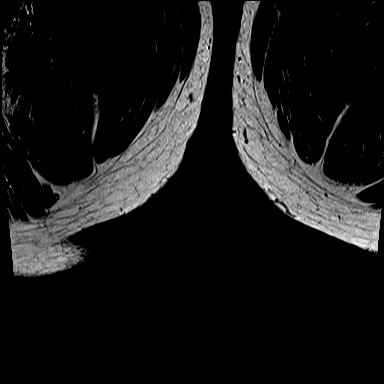
[im 8/36]
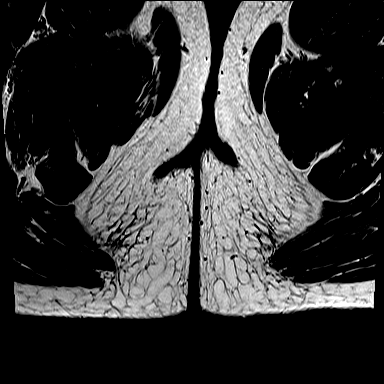
[im 15/36]
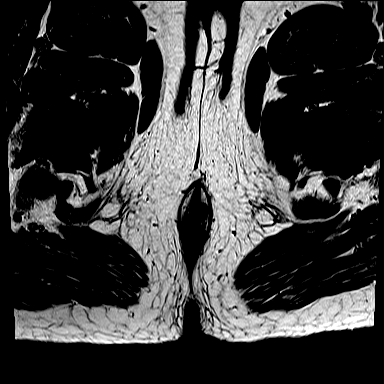
[im 22/36]
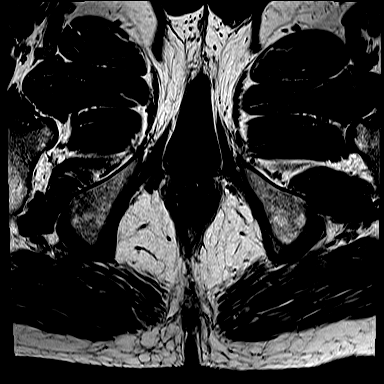
[im 29/36]
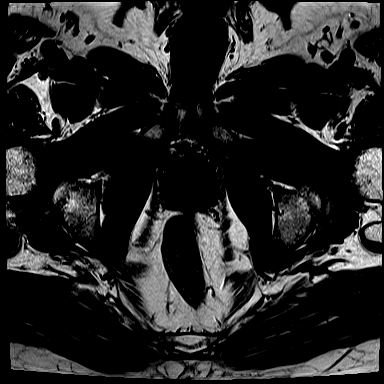
[im 36/36]
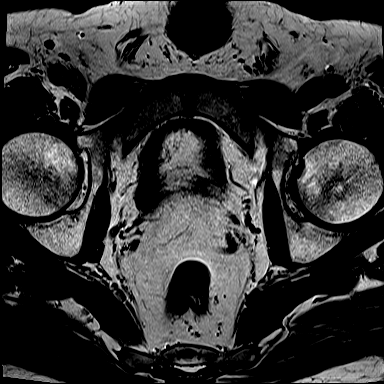

[Series 6: t2_tse axial obl · axial · 4.0mm · 0.69mm/px · z∈[-125,+30]mm · 6 of 36 slices shown (1 of 2)]
[im 1/36]
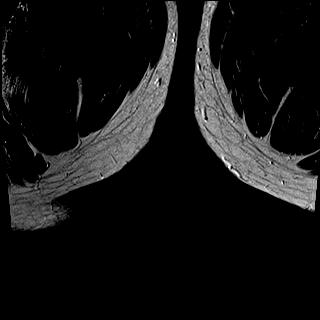
[im 8/36]
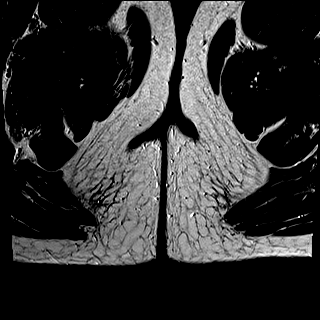
[im 15/36]
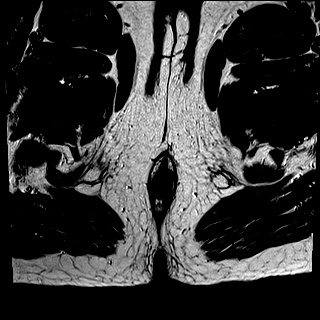
[im 22/36]
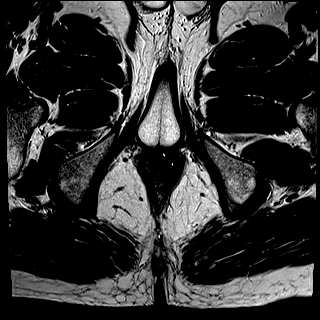
[im 29/36]
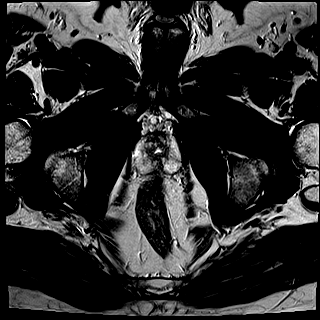
[im 36/36]
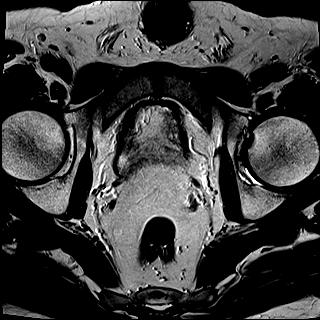

[Series 7: t2_tse axial obl · axial · 4.0mm · 0.69mm/px · z∈[-125,+30]mm · 6 of 36 slices shown (2 of 2)]
[im 1/36]
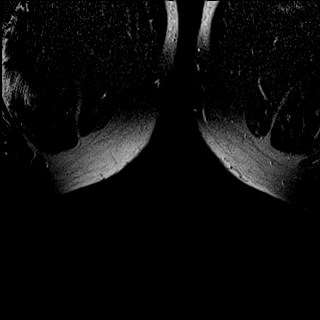
[im 8/36]
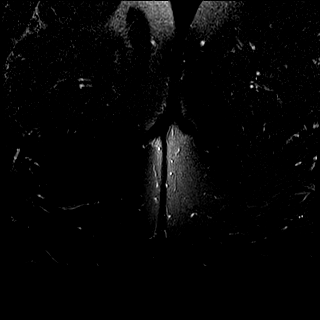
[im 15/36]
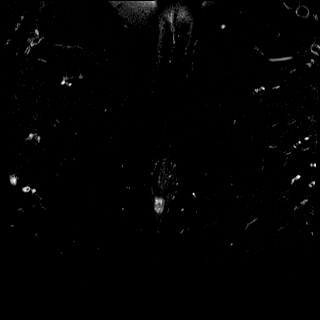
[im 22/36]
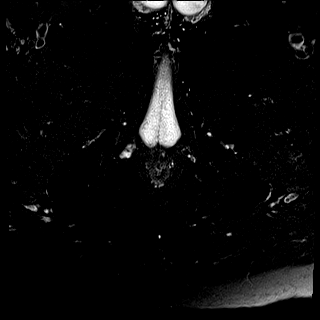
[im 29/36]
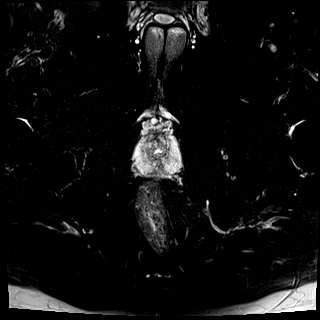
[im 36/36]
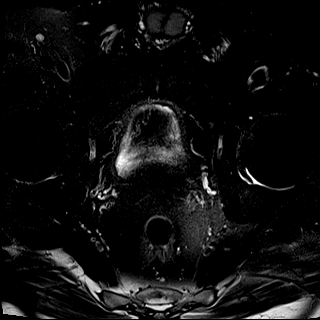

[Series 8: t2_tse_cor obl · coronal · 4.0mm · 0.78mm/px · 5 of 34 slices shown]
[im 1/34]
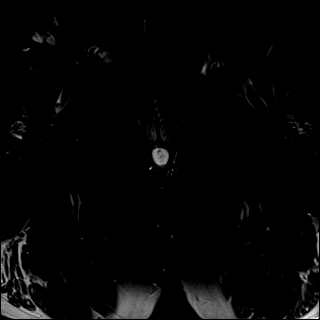
[im 9/34]
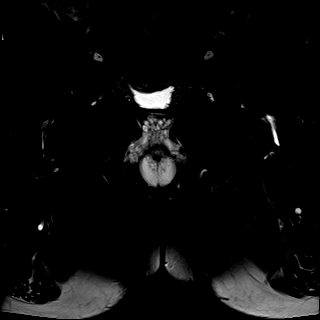
[im 17/34]
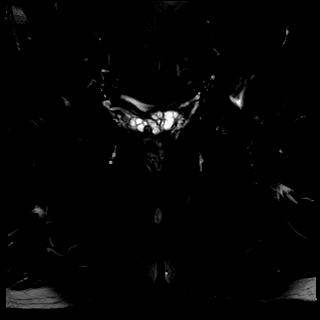
[im 25/34]
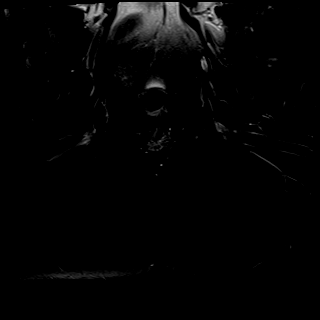
[im 34/34]
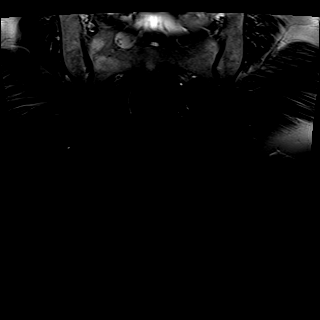

[Series 9: post t1_tse cor · coronal · 4.0mm · 0.49mm/px · 2 of 34 slices shown]
[im 1/34]
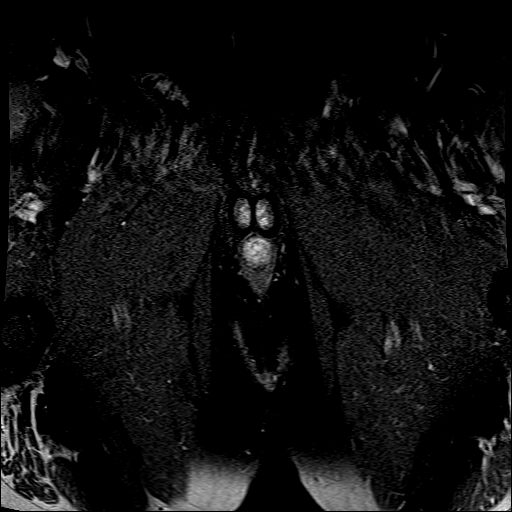
[im 9/34]
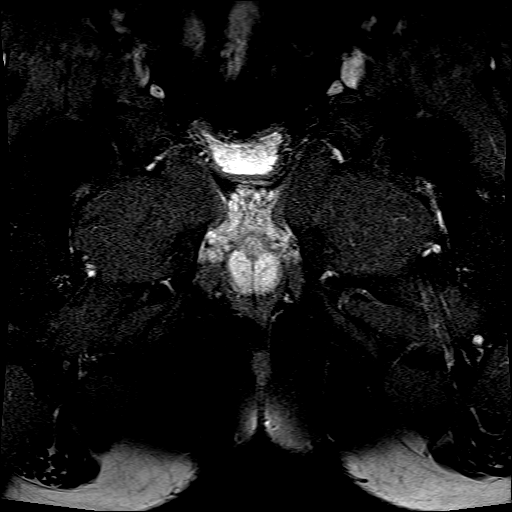

[39 of 48 positions shown; findings below may reference images not displayed]

FINDINGS: Urinary Tract: Collapsed and grossly normal bladder. Visualized
distal ureters are normal caliber.

Bowel: There is a 6 o'clock perianal fistula (best seen on
postcontrast T1 sequence series 10/ image 20), which appears to be
trans-sphincteric, traversing the external sphincter in the right
posterior paramedian location, with posterior/ inferior continuation
of the 3.5 cm length x 1.1 cm diameter fistula tract to the skin
surface in the midline Malilo cleft region (series 4/ image 24),
noting a thick enhancing fistula wall with a small amount of pus
centrally within the fistula track. There is a separate short
(approximately 1 cm length) fistula tract coursing superiorly in the
left intersphincteric space from the site of the 6 o'clock perianal
fistula (series 6/images 19-22), without associated drainable
abscess in the intersphincteric space. Rectum and visualized pelvic
small bowel loops are normal caliber without wall thickening.

Vascular/Lymphatic: No pathologically enlarged lymph nodes. No
significant vascular abnormality seen.

Reproductive: Normal size prostate. Symmetric normal seminal
vesicles.

Other:  No ischiorectal fossa abscess.

Musculoskeletal: No aggressive appearing focal osseous lesions.
IMPRESSION: 1. Trans-sphincteric 6 o'clock perianal fistula with thick enhancing
wall and internal pus extending to the skin surface in the midline
Malilo cleft region, as described.
2. Separate short left intersphincteric space fistula tract coursing
superiorly from the 6 o'clock perianal fistula.
3. No focal drainable abscess.

## 2017-06-12 DIAGNOSIS — J019 Acute sinusitis, unspecified: Secondary | ICD-10-CM | POA: Diagnosis not present

## 2017-08-17 DIAGNOSIS — K439 Ventral hernia without obstruction or gangrene: Secondary | ICD-10-CM | POA: Diagnosis not present

## 2017-08-17 DIAGNOSIS — R109 Unspecified abdominal pain: Secondary | ICD-10-CM | POA: Diagnosis not present

## 2018-03-13 DIAGNOSIS — J019 Acute sinusitis, unspecified: Secondary | ICD-10-CM | POA: Diagnosis not present

## 2018-12-27 ENCOUNTER — Ambulatory Visit
Admission: RE | Admit: 2018-12-27 | Discharge: 2018-12-27 | Disposition: A | Discharge status: Home / Self Care | Payer: Worker's Compensation | Source: Ambulatory Visit | Attending: Nurse Practitioner | Admitting: Nurse Practitioner

## 2018-12-27 ENCOUNTER — Other Ambulatory Visit: Payer: Self-pay

## 2018-12-27 ENCOUNTER — Other Ambulatory Visit: Payer: Self-pay | Admitting: Nurse Practitioner

## 2018-12-27 DIAGNOSIS — T148XXA Other injury of unspecified body region, initial encounter: Secondary | ICD-10-CM

## 2018-12-27 DIAGNOSIS — M25572 Pain in left ankle and joints of left foot: Secondary | ICD-10-CM

## 2018-12-27 DIAGNOSIS — R609 Edema, unspecified: Secondary | ICD-10-CM
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
## 2022-06-03 ENCOUNTER — Telehealth: Payer: Self-pay | Admitting: General Practice

## 2022-06-03 NOTE — Telephone Encounter (Signed)
Pt is the brother of Fouad Taul current pt of Dr Birdie Riddle. He would like to know if Dr Birdie Riddle would take him on a new pt.

## 2022-06-03 NOTE — Telephone Encounter (Signed)
Ok to establish 

## 2022-06-06 NOTE — Telephone Encounter (Signed)
Called patient and scheduled for 07/14/22 2:20

## 2022-07-14 ENCOUNTER — Encounter: Payer: Self-pay | Admitting: Family Medicine

## 2022-07-14 ENCOUNTER — Ambulatory Visit: Payer: 59 | Admitting: Family Medicine

## 2022-07-14 VITALS — BP 118/84 | HR 83 | Temp 97.9°F | Resp 17 | Ht 72.0 in | Wt 207.5 lb

## 2022-07-14 DIAGNOSIS — Z0000 Encounter for general adult medical examination without abnormal findings: Secondary | ICD-10-CM | POA: Diagnosis not present

## 2022-07-14 DIAGNOSIS — E785 Hyperlipidemia, unspecified: Secondary | ICD-10-CM

## 2022-07-14 NOTE — Patient Instructions (Signed)
Follow up in 6 months to recheck cholesterol We'll notify you of your lab results and make any changes if needed Keep up the good work on healthy diet and regular exercise- you look great!!! Call with any questions or concerns Stay Safe!  Stay Healthy! Welcome!  We're glad to have you! Happy Birthday!!!

## 2022-07-14 NOTE — Assessment & Plan Note (Signed)
Pt's PE WNL.  UTD on Tdap, colonoscopy- attempting to get records.  Check labs.  Anticipatory guidance provided.

## 2022-07-14 NOTE — Progress Notes (Signed)
   Subjective:    Patient ID: Dean Hoover, male    DOB: July 29, 1970, 52 y.o.   MRN: [id number]  HPI Here today to establish care.  No concerns today.  CPE- UTD on colonoscopy (Dr Earlean Shawl- attempting to get records), Tdap.   Review of Systems Patient reports no vision/hearing changes, anorexia, fever ,adenopathy, persistant/recurrent hoarseness, swallowing issues, chest pain, palpitations, edema, persistant/recurrent cough, hemoptysis, dyspnea (rest,exertional, paroxysmal nocturnal), gastrointestinal  bleeding (melena, rectal bleeding), abdominal pain, excessive heart burn, GU symptoms (dysuria, hematuria, voiding/incontinence issues) syncope, focal weakness, memory loss, numbness & tingling, skin/hair/nail changes, depression, anxiety, abnormal bruising/bleeding, musculoskeletal symptoms/signs.     Objective:   Physical Exam General Appearance:    Alert, cooperative, no distress, appears stated age  Head:    Normocephalic, without obvious abnormality, atraumatic  Eyes:    PERRL, conjunctiva/corneas clear, EOM's intact both eyes       Ears:    Normal TM's and external ear canals, both ears.  R cochlear anchor  Nose:   Nares normal, septum midline, mucosa normal, no drainage   or sinus tenderness  Throat:   Lips, mucosa, and tongue normal; teeth and gums normal  Neck:   Supple, symmetrical, trachea midline, no adenopathy;       thyroid:  No enlargement/tenderness/nodules  Back:     Symmetric, no curvature, ROM normal, no CVA tenderness  Lungs:     Clear to auscultation bilaterally, respirations unlabored  Chest wall:    No tenderness or deformity  Heart:    Regular rate and rhythm, S1 and S2 normal, no murmur, rub   or gallop  Abdomen:     Soft, non-tender, bowel sounds active all four quadrants,    no masses, no organomegaly  Genitalia:    deferred  Rectal:    Extremities:   Extremities normal, atraumatic, no cyanosis or edema  Pulses:   2+ and symmetric all extremities  Skin:    Skin color, texture, turgor normal, no rashes or lesions  Lymph nodes:   Cervical, supraclavicular, and axillary nodes normal  Neurologic:   CNII-XII intact. Normal strength, sensation and reflexes      throughout          Assessment & Plan:

## 2022-07-15 LAB — HEPATIC FUNCTION PANEL
ALT: 23 U/L (ref 0–53)
AST: 20 U/L (ref 0–37)
Albumin: 4.4 g/dL (ref 3.5–5.2)
Alkaline Phosphatase: 51 U/L (ref 39–117)
Bilirubin, Direct: 0.2 mg/dL (ref 0.0–0.3)
Total Bilirubin: 0.7 mg/dL (ref 0.2–1.2)
Total Protein: 7.5 g/dL (ref 6.0–8.3)

## 2022-07-15 LAB — CBC WITH DIFFERENTIAL/PLATELET
Basophils Absolute: 0 10*3/uL (ref 0.0–0.1)
Basophils Relative: 0.3 % (ref 0.0–3.0)
Eosinophils Absolute: 0.2 10*3/uL (ref 0.0–0.7)
Eosinophils Relative: 2.2 % (ref 0.0–5.0)
HCT: 45.8 % (ref 39.0–52.0)
Hemoglobin: 15.7 g/dL (ref 13.0–17.0)
Lymphocytes Relative: 34.9 % (ref 12.0–46.0)
Lymphs Abs: 2.8 10*3/uL (ref 0.7–4.0)
MCHC: 34.3 g/dL (ref 30.0–36.0)
MCV: 95.5 fl (ref 78.0–100.0)
Monocytes Absolute: 0.8 10*3/uL (ref 0.1–1.0)
Monocytes Relative: 9.7 % (ref 3.0–12.0)
Neutro Abs: 4.2 10*3/uL (ref 1.4–7.7)
Neutrophils Relative %: 52.9 % (ref 43.0–77.0)
Platelets: 224 10*3/uL (ref 150.0–400.0)
RBC: 4.79 Mil/uL (ref 4.22–5.81)
RDW: 13.7 % (ref 11.5–15.5)
WBC: 7.9 10*3/uL (ref 4.0–10.5)

## 2022-07-15 LAB — BASIC METABOLIC PANEL
BUN: 10 mg/dL (ref 6–23)
CO2: 25 mEq/L (ref 19–32)
Calcium: 10.1 mg/dL (ref 8.4–10.5)
Chloride: 104 mEq/L (ref 96–112)
Creatinine, Ser: 1.07 mg/dL (ref 0.40–1.50)
GFR: 81.21 mL/min (ref >60.00)
Glucose, Bld: 76 mg/dL (ref 70–99)
Potassium: 4.1 mEq/L (ref 3.5–5.1)
Sodium: 140 mEq/L (ref 135–145)

## 2022-07-15 LAB — TSH: TSH: 2.68 u[IU]/mL (ref 0.35–5.50)

## 2022-07-15 LAB — LIPID PANEL
Cholesterol: 172 mg/dL (ref 0–200)
HDL: 47.7 mg/dL (ref >39.00)
LDL Cholesterol: 104 mg/dL — ABNORMAL HIGH (ref 0–99)
NonHDL: 123.98
Total CHOL/HDL Ratio: 4
Triglycerides: 101 mg/dL (ref 0.0–149.0)
VLDL: 20.2 mg/dL (ref 0.0–40.0)

## 2022-07-18 ENCOUNTER — Telehealth: Payer: Self-pay

## 2022-07-18 NOTE — Telephone Encounter (Signed)
Pt seen results Via my chart  

## 2022-07-18 NOTE — Telephone Encounter (Signed)
-----   Message from Midge Minium, MD sent at 07/18/2022  7:30 AM EDT ----- Labs look great!  No changes at this time.  Happy [birth year] Birthday!

## 2022-12-22 ENCOUNTER — Encounter (INDEPENDENT_AMBULATORY_CARE_PROVIDER_SITE_OTHER): Payer: Self-pay

## 2023-01-16 ENCOUNTER — Ambulatory Visit: Payer: 59 | Admitting: Family Medicine

## 2023-01-16 ENCOUNTER — Encounter: Payer: Self-pay | Admitting: Family Medicine

## 2023-01-16 VITALS — BP 112/84 | HR 75 | Temp 98.1°F | Resp 18 | Wt 210.5 lb

## 2023-01-16 DIAGNOSIS — E785 Hyperlipidemia, unspecified: Secondary | ICD-10-CM

## 2023-01-16 NOTE — Assessment & Plan Note (Signed)
Ongoing issue.  Tolerating Lipitor w/o difficulty.  Check labs.  Adjust meds prn

## 2023-01-16 NOTE — Patient Instructions (Signed)
Schedule your complete physical in 6 months We'll notify you of your lab results and make any changes if needed Keep up the good work on healthy diet and regular exercise- you look great!! Call with any questions or concerns Stay Safe!  Stay Healthy! Happy Fall!! 

## 2023-01-16 NOTE — Progress Notes (Signed)
   Subjective:    Patient ID: Dean Hoover, male    DOB: December 17, 1970, 52 y.o.   MRN: [id number]  HPI Hyperlipidemia- ongoing issue.  Currently on Lipitor 20mg  daily.  Pt reports feeling good.  No CP, SOB, abd pain, N/V.  No regular exercise but is active at home and at work.     Review of Systems For ROS see HPI     Objective:   Physical Exam Vitals reviewed.  Constitutional:      General: He is not in acute distress.    Appearance: Normal appearance. He is well-developed. He is not ill-appearing.  HENT:     Head: Normocephalic and atraumatic.  Eyes:     Extraocular Movements: Extraocular movements intact.     Conjunctiva/sclera: Conjunctivae normal.     Pupils: Pupils are equal, round, and reactive to light.  Neck:     Thyroid: No thyromegaly.  Cardiovascular:     Rate and Rhythm: Normal rate and regular rhythm.     Pulses: Normal pulses.     Heart sounds: Normal heart sounds. No murmur heard. Pulmonary:     Effort: Pulmonary effort is normal. No respiratory distress.     Breath sounds: Normal breath sounds.  Abdominal:     General: Bowel sounds are normal. There is no distension.     Palpations: Abdomen is soft.  Musculoskeletal:     Cervical back: Normal range of motion and neck supple.     Right lower leg: No edema.     Left lower leg: No edema.  Lymphadenopathy:     Cervical: No cervical adenopathy.  Skin:    General: Skin is warm and dry.  Neurological:     General: No focal deficit present.     Mental Status: He is alert and oriented to person, place, and time.     Cranial Nerves: No cranial nerve deficit.  Psychiatric:        Mood and Affect: Mood normal.        Behavior: Behavior normal.           Assessment & Plan:

## 2023-01-17 LAB — TSH: TSH: 2.94 u[IU]/mL (ref 0.35–5.50)

## 2023-01-17 LAB — HEPATIC FUNCTION PANEL
ALT: 25 U/L (ref 0–53)
AST: 21 U/L (ref 0–37)
Albumin: 4.4 g/dL (ref 3.5–5.2)
Alkaline Phosphatase: 49 U/L (ref 39–117)
Bilirubin, Direct: 0.1 mg/dL (ref 0.0–0.3)
Total Bilirubin: 0.7 mg/dL (ref 0.2–1.2)
Total Protein: 7.4 g/dL (ref 6.0–8.3)

## 2023-01-17 LAB — CBC WITH DIFFERENTIAL/PLATELET
Basophils Absolute: 0 10*3/uL (ref 0.0–0.1)
Basophils Relative: 0.5 % (ref 0.0–3.0)
Eosinophils Absolute: 0.1 10*3/uL (ref 0.0–0.7)
Eosinophils Relative: 1.6 % (ref 0.0–5.0)
HCT: 45.3 % (ref 39.0–52.0)
Hemoglobin: 14.8 g/dL (ref 13.0–17.0)
Lymphocytes Relative: 27.2 % (ref 12.0–46.0)
Lymphs Abs: 2.2 10*3/uL (ref 0.7–4.0)
MCHC: 32.8 g/dL (ref 30.0–36.0)
MCV: 97.8 fl (ref 78.0–100.0)
Monocytes Absolute: 0.7 10*3/uL (ref 0.1–1.0)
Monocytes Relative: 8.4 % (ref 3.0–12.0)
Neutro Abs: 5.1 10*3/uL (ref 1.4–7.7)
Neutrophils Relative %: 62.3 % (ref 43.0–77.0)
Platelets: 231 10*3/uL (ref 150.0–400.0)
RBC: 4.63 Mil/uL (ref 4.22–5.81)
RDW: 13.7 % (ref 11.5–15.5)
WBC: 8.2 10*3/uL (ref 4.0–10.5)

## 2023-01-17 LAB — LIPID PANEL
Cholesterol: 158 mg/dL (ref 0–200)
HDL: 51.5 mg/dL (ref >39.00)
LDL Cholesterol: 86 mg/dL (ref 0–99)
NonHDL: 106.28
Total CHOL/HDL Ratio: 3
Triglycerides: 100 mg/dL (ref 0.0–149.0)
VLDL: 20 mg/dL (ref 0.0–40.0)

## 2023-01-17 LAB — BASIC METABOLIC PANEL
BUN: 9 mg/dL (ref 6–23)
CO2: 29 meq/L (ref 19–32)
Calcium: 9.8 mg/dL (ref 8.4–10.5)
Chloride: 102 meq/L (ref 96–112)
Creatinine, Ser: 1 mg/dL (ref 0.40–1.50)
GFR: 87.76 mL/min (ref >60.00)
Glucose, Bld: 83 mg/dL (ref 70–99)
Potassium: 4.3 meq/L (ref 3.5–5.1)
Sodium: 137 meq/L (ref 135–145)

## 2023-01-18 ENCOUNTER — Telehealth: Payer: Self-pay

## 2023-01-18 NOTE — Telephone Encounter (Signed)
-----   Message from Neena Rhymes sent at 01/18/2023  7:37 AM EDT ----- Labs look great!  No changes at this time

## 2023-01-18 NOTE — Telephone Encounter (Signed)
Pt is aware of the results

## 2023-03-22 ENCOUNTER — Other Ambulatory Visit: Payer: Self-pay | Admitting: Family Medicine

## 2023-03-22 MED ORDER — ATORVASTATIN CALCIUM 20 MG PO TABS: 20.0000 mg | ORAL_TABLET | Freq: Every day | ORAL | 1 refills | Status: DC

## 2023-03-22 NOTE — Telephone Encounter (Addendum)
Encourage patient to contact the pharmacy for refills or they can request refills through [facility]  (Please schedule appointment if patient has not been seen in over a year)    WHAT PHARMACY WOULD THEY LIKE THIS SENT TO: [id number] - [place], [place] - [address]   MEDICATION NAME & DOSE: atorvastatin (LIPITOR) 20 MG tablet   NOTES/COMMENTS FROM PATIENT: Previous PCP prescribed this medication but he still takes it daily. Pt has one more dose left.     Front office please notify patient: It takes 48-72 hours to process rx refill requests Ask patient to call pharmacy to ensure rx is ready before heading there.

## 2023-03-22 NOTE — Telephone Encounter (Signed)
This is not currently written under your name is I okay to send per pt request?

## 2023-07-17 ENCOUNTER — Encounter: Payer: Self-pay | Admitting: Family Medicine

## 2023-07-17 ENCOUNTER — Ambulatory Visit (INDEPENDENT_AMBULATORY_CARE_PROVIDER_SITE_OTHER): Payer: 59 | Admitting: Family Medicine

## 2023-07-17 VITALS — BP 124/70 | HR 69 | Temp 98.6°F | Ht 72.0 in | Wt 207.4 lb

## 2023-07-17 DIAGNOSIS — Z114 Encounter for screening for human immunodeficiency virus [HIV]: Secondary | ICD-10-CM

## 2023-07-17 DIAGNOSIS — Z1159 Encounter for screening for other viral diseases: Secondary | ICD-10-CM

## 2023-07-17 DIAGNOSIS — Z0000 Encounter for general adult medical examination without abnormal findings: Secondary | ICD-10-CM

## 2023-07-17 DIAGNOSIS — Z125 Encounter for screening for malignant neoplasm of prostate: Secondary | ICD-10-CM

## 2023-07-17 DIAGNOSIS — E785 Hyperlipidemia, unspecified: Secondary | ICD-10-CM | POA: Diagnosis not present

## 2023-07-17 DIAGNOSIS — L989 Disorder of the skin and subcutaneous tissue, unspecified: Secondary | ICD-10-CM

## 2023-07-17 MED ORDER — DICYCLOMINE HCL 20 MG PO TABS: 20.0000 mg | ORAL_TABLET | Freq: Three times a day (TID) | ORAL | 0 refills | Status: AC | PRN

## 2023-07-17 NOTE — Patient Instructions (Addendum)
 Follow up in 6 months to recheck cholesterol We'll notify you of your lab results and make any changes if needed Continue to work on healthy diet and regular exercise- you can do it! Schedule your colonoscopy at your convenience Call with any questions or concerns Stay Safe!  Stay Healthy! Happy Birthday!!!

## 2023-07-17 NOTE — Progress Notes (Unsigned)
   Subjective:    Patient ID: Dean Hoover, male    DOB: 1970/06/28, 53 y.o.   MRN: [id number]  HPI CPE- Due for colonoscopy, HIV, Hep C  Patient Care Team    Relationship Specialty Notifications Start End  Sheliah Hatch, MD PCP - General Family Medicine  07/14/22     Health Maintenance  Topic Date Due   HIV Screening  Never done   Hepatitis C Screening  Never done   Colonoscopy  Never done   Zoster Vaccines- Shingrix (1 of 2) Never done   INFLUENZA VACCINE  Never done   COVID-19 Vaccine (1 - 2024-25 season) Never done   DTaP/Tdap/Td (3 - Td or Tdap) 11/26/2031   HPV VACCINES  Aged Out      Review of Systems Patient reports no vision/hearing changes, anorexia, fever ,adenopathy, persistant/recurrent hoarseness, swallowing issues, chest pain, palpitations, edema, persistant/recurrent cough, hemoptysis, dyspnea (rest,exertional, paroxysmal nocturnal), gastrointestinal  bleeding (melena, rectal bleeding), abdominal pain, excessive heart burn, GU symptoms (dysuria, hematuria, voiding/incontinence issues) syncope, focal weakness, memory loss, numbness & tingling, skin/hair/nail changes, depression, anxiety, abnormal bruising/bleeding, musculoskeletal symptoms/signs.     Objective:   Physical Exam General Appearance:    Alert, cooperative, no distress, appears stated age  Head:    Normocephalic, without obvious abnormality, atraumatic  Eyes:    PERRL, conjunctiva/corneas clear, EOM's intact both eyes       Ears:    Normal TM's and external ear canals, both ears  Nose:   Nares normal, septum midline, mucosa normal, no drainage   or sinus tenderness  Throat:   Lips, mucosa, and tongue normal; teeth and gums normal  Neck:   Supple, symmetrical, trachea midline, no adenopathy;       thyroid:  No enlargement/tenderness/nodules  Back:     Symmetric, no curvature, ROM normal, no CVA tenderness  Lungs:     Clear to auscultation bilaterally, respirations unlabored  Chest wall:    No  tenderness or deformity  Heart:    Regular rate and rhythm, S1 and S2 normal, no murmur, rub   or gallop  Abdomen:     Soft, non-tender, bowel sounds active all four quadrants,    no masses, no organomegaly  Genitalia:    deferred  Rectal:    Extremities:   Extremities normal, atraumatic, no cyanosis or edema  Pulses:   2+ and symmetric all extremities  Skin:   Skin color, texture, turgor normal, no rashes.  Erythematous, raw area over previous surgical scar on L shoulder  Lymph nodes:   Cervical, supraclavicular, and axillary nodes normal  Neurologic:   CNII-XII intact. Normal strength, sensation and reflexes      throughout          Assessment & Plan:

## 2023-07-18 LAB — CBC WITH DIFFERENTIAL/PLATELET
Basophils Absolute: 0 10*3/uL (ref 0.0–0.1)
Basophils Relative: 0.4 % (ref 0.0–3.0)
Eosinophils Absolute: 0.1 10*3/uL (ref 0.0–0.7)
Eosinophils Relative: 1.6 % (ref 0.0–5.0)
HCT: 45.5 % (ref 39.0–52.0)
Hemoglobin: 15.2 g/dL (ref 13.0–17.0)
Lymphocytes Relative: 30.4 % (ref 12.0–46.0)
Lymphs Abs: 2.4 10*3/uL (ref 0.7–4.0)
MCHC: 33.4 g/dL (ref 30.0–36.0)
MCV: 98.8 fl (ref 78.0–100.0)
Monocytes Absolute: 0.9 10*3/uL (ref 0.1–1.0)
Monocytes Relative: 11.1 % (ref 3.0–12.0)
Neutro Abs: 4.5 10*3/uL (ref 1.4–7.7)
Neutrophils Relative %: 56.5 % (ref 43.0–77.0)
Platelets: 194 10*3/uL (ref 150.0–400.0)
RBC: 4.61 Mil/uL (ref 4.22–5.81)
RDW: 13.7 % (ref 11.5–15.5)
WBC: 8 10*3/uL (ref 4.0–10.5)

## 2023-07-18 LAB — BASIC METABOLIC PANEL
BUN: 12 mg/dL (ref 6–23)
CO2: 31 meq/L (ref 19–32)
Calcium: 9.8 mg/dL (ref 8.4–10.5)
Chloride: 104 meq/L (ref 96–112)
Creatinine, Ser: 1 mg/dL (ref 0.40–1.50)
GFR: 87.45 mL/min (ref >60.00)
Glucose, Bld: 81 mg/dL (ref 70–99)
Potassium: 4.6 meq/L (ref 3.5–5.1)
Sodium: 140 meq/L (ref 135–145)

## 2023-07-18 LAB — HEPATIC FUNCTION PANEL
ALT: 19 U/L (ref 0–53)
AST: 18 U/L (ref 0–37)
Albumin: 4.6 g/dL (ref 3.5–5.2)
Alkaline Phosphatase: 50 U/L (ref 39–117)
Bilirubin, Direct: 0.1 mg/dL (ref 0.0–0.3)
Total Bilirubin: 0.4 mg/dL (ref 0.2–1.2)
Total Protein: 7.4 g/dL (ref 6.0–8.3)

## 2023-07-18 LAB — LIPID PANEL
Cholesterol: 165 mg/dL (ref 0–200)
HDL: 56.3 mg/dL (ref >39.00)
LDL Cholesterol: 91 mg/dL (ref 0–99)
NonHDL: 108.53
Total CHOL/HDL Ratio: 3
Triglycerides: 90 mg/dL (ref 0.0–149.0)
VLDL: 18 mg/dL (ref 0.0–40.0)

## 2023-07-18 LAB — HIV ANTIBODY (ROUTINE TESTING W REFLEX): HIV 1&2 Ab, 4th Generation: NONREACTIVE

## 2023-07-18 LAB — HEPATITIS C ANTIBODY: Hepatitis C Ab: NONREACTIVE

## 2023-07-18 LAB — PSA: PSA: 0.32 ng/mL (ref 0.10–4.00)

## 2023-07-18 LAB — TSH: TSH: 2.73 u[IU]/mL (ref 0.35–5.50)

## 2023-07-18 NOTE — Assessment & Plan Note (Signed)
 Pt's PE WNL.  Due for colonoscopy- pt to schedule.  Check labs.  Anticipatory guidance provided.

## 2023-07-19 ENCOUNTER — Encounter: Payer: Self-pay | Admitting: Family Medicine

## 2023-07-19 NOTE — Telephone Encounter (Signed)
 Looking in patients chart I see the referral for dermatology was created and sent but there is a message under the referral stating they could not process the referral until the visit progress notes were signed by Dr.Tabori. Once this is completed I can reach out to the referral team to let them know they can proceed with the referral.

## 2023-09-03 ENCOUNTER — Other Ambulatory Visit: Payer: Self-pay | Admitting: Family Medicine

## 2023-09-07 ENCOUNTER — Other Ambulatory Visit: Payer: Self-pay | Admitting: Family Medicine

## 2023-11-07 HISTORY — PX: OTHER SURGICAL HISTORY: SHX169

## 2024-01-16 DIAGNOSIS — Z85828 Personal history of other malignant neoplasm of skin: Secondary | ICD-10-CM | POA: Insufficient documentation

## 2024-01-17 ENCOUNTER — Ambulatory Visit: Admitting: Family Medicine

## 2024-01-17 ENCOUNTER — Encounter: Payer: Self-pay | Admitting: Family Medicine

## 2024-01-17 VITALS — BP 108/72 | HR 66 | Temp 98.1°F | Ht 72.0 in | Wt 211.0 lb

## 2024-01-17 DIAGNOSIS — L989 Disorder of the skin and subcutaneous tissue, unspecified: Secondary | ICD-10-CM

## 2024-01-17 DIAGNOSIS — E785 Hyperlipidemia, unspecified: Secondary | ICD-10-CM | POA: Diagnosis not present

## 2024-01-17 DIAGNOSIS — M25559 Pain in unspecified hip: Secondary | ICD-10-CM | POA: Diagnosis not present

## 2024-01-17 NOTE — Assessment & Plan Note (Signed)
Chronic problem.  On Lipitor 20mg daily w/o difficulty.  Check labs.  Adjust meds prn  

## 2024-01-17 NOTE — Progress Notes (Signed)
   Subjective:    Patient ID: Dean Hoover, male    DOB: Sep 12, 1970, 53 y.o.   MRN: [id number]  HPI Hyperlipidemia- chronic problem, on Lipitor 20mg  daily w/o difficulty.  Denies CP, SOB, abd pain, N/V.  Hip pain- pt reports L hip started hurting over the weekend.  Prior to that, had some pain in R hip that was most notable w/ prolonged walking or when rising from a seated position.  Pain is localized over greater trochanter.  Skin lesion- anterior neck.  1st noticed ~1 month ago.  Has hx of basal cell.   Review of Systems For ROS see HPI     Objective:   Physical Exam Vitals reviewed.  Constitutional:      General: He is not in acute distress.    Appearance: Normal appearance. He is well-developed.  HENT:     Head: Normocephalic and atraumatic.  Eyes:     Extraocular Movements: Extraocular movements intact.     Conjunctiva/sclera: Conjunctivae normal.     Pupils: Pupils are equal, round, and reactive to light.  Neck:     Thyroid : No thyromegaly.  Cardiovascular:     Rate and Rhythm: Normal rate and regular rhythm.     Pulses: Normal pulses.     Heart sounds: Normal heart sounds. No murmur heard. Pulmonary:     Effort: Pulmonary effort is normal. No respiratory distress.     Breath sounds: Normal breath sounds.  Abdominal:     General: Bowel sounds are normal. There is no distension.     Palpations: Abdomen is soft.  Musculoskeletal:        General: Tenderness (TTP over L greater trochanter) present.     Cervical back: Normal range of motion and neck supple.     Right lower leg: No edema.     Left lower leg: No edema.  Lymphadenopathy:     Cervical: No cervical adenopathy.  Skin:    General: Skin is warm and dry.     Findings: Lesion (0.75cm pearly, well circumscribed lesion on anterior neck) present.  Neurological:     General: No focal deficit present.     Mental Status: He is alert and oriented to person, place, and time.     Cranial Nerves: No cranial nerve  deficit.  Psychiatric:        Mood and Affect: Mood normal.        Behavior: Behavior normal.           Assessment & Plan:  Lateral hip pain- new.  Given location of pt's pain this is consistent w/ trochanteric bursitis.  Encouraged ice and NSAIDs prn.  If no improvement, he will let me know and we can refer to Ortho or Sports Med.  Pt expressed understanding and is in agreement w/ plan.   Skin lesion- new.  Anterior neck.  Pearly and well circumscribed.  Just appeared in the last month or so.  Recent dx of basal cell on L shoulder.  Refer back to derm.  Pt expressed understanding and is in agreement w/ plan.

## 2024-01-17 NOTE — Patient Instructions (Signed)
 Schedule your complete physical in 6 months We'll notify you of your lab results and make any changes if needed Take Ibuprofen as needed for pain Call your Dermatologist about the skin lesion on your neck Call with any questions or concerns Stay Safe!  Stay Healthy! Happy Fall!!

## 2024-01-18 LAB — BASIC METABOLIC PANEL WITH GFR
BUN: 15 mg/dL (ref 6–23)
CO2: 27 meq/L (ref 19–32)
Calcium: 10.1 mg/dL (ref 8.4–10.5)
Chloride: 103 meq/L (ref 96–112)
Creatinine, Ser: 1.15 mg/dL (ref 0.40–1.50)
GFR: 73.69 mL/min (ref >60.00)
Glucose, Bld: 83 mg/dL (ref 70–99)
Potassium: 4.1 meq/L (ref 3.5–5.1)
Sodium: 139 meq/L (ref 135–145)

## 2024-01-18 LAB — CBC WITH DIFFERENTIAL/PLATELET
Basophils Absolute: 0 K/uL (ref 0.0–0.1)
Basophils Relative: 0.5 % (ref 0.0–3.0)
Eosinophils Absolute: 0.3 K/uL (ref 0.0–0.7)
Eosinophils Relative: 4.7 % (ref 0.0–5.0)
HCT: 45.5 % (ref 39.0–52.0)
Hemoglobin: 15.1 g/dL (ref 13.0–17.0)
Lymphocytes Relative: 34.5 % (ref 12.0–46.0)
Lymphs Abs: 2.3 K/uL (ref 0.7–4.0)
MCHC: 33.1 g/dL (ref 30.0–36.0)
MCV: 96.4 fl (ref 78.0–100.0)
Monocytes Absolute: 0.8 K/uL (ref 0.1–1.0)
Monocytes Relative: 12.3 % — ABNORMAL HIGH (ref 3.0–12.0)
Neutro Abs: 3.2 K/uL (ref 1.4–7.7)
Neutrophils Relative %: 48 % (ref 43.0–77.0)
Platelets: 213 K/uL (ref 150.0–400.0)
RBC: 4.72 Mil/uL (ref 4.22–5.81)
RDW: 13.4 % (ref 11.5–15.5)
WBC: 6.6 K/uL (ref 4.0–10.5)

## 2024-01-18 LAB — HEPATIC FUNCTION PANEL
ALT: 29 U/L (ref 0–53)
AST: 26 U/L (ref 0–37)
Albumin: 4.5 g/dL (ref 3.5–5.2)
Alkaline Phosphatase: 53 U/L (ref 39–117)
Bilirubin, Direct: 0.1 mg/dL (ref 0.0–0.3)
Total Bilirubin: 0.5 mg/dL (ref 0.2–1.2)
Total Protein: 7.5 g/dL (ref 6.0–8.3)

## 2024-01-18 LAB — LIPID PANEL
Cholesterol: 170 mg/dL (ref 0–200)
HDL: 44.2 mg/dL (ref >39.00)
LDL Cholesterol: 89 mg/dL (ref 0–99)
NonHDL: 125.53
Total CHOL/HDL Ratio: 4
Triglycerides: 185 mg/dL — ABNORMAL HIGH (ref 0.0–149.0)
VLDL: 37 mg/dL (ref 0.0–40.0)

## 2024-01-18 LAB — TSH: TSH: 1.81 u[IU]/mL (ref 0.35–5.50)

## 2024-01-19 ENCOUNTER — Ambulatory Visit: Payer: Self-pay | Admitting: Family Medicine

## 2024-02-14 ENCOUNTER — Other Ambulatory Visit: Payer: Self-pay

## 2024-02-14 ENCOUNTER — Telehealth: Payer: Self-pay | Admitting: Family Medicine

## 2024-02-14 DIAGNOSIS — Z1211 Encounter for screening for malignant neoplasm of colon: Secondary | ICD-10-CM

## 2024-02-14 NOTE — Telephone Encounter (Signed)
 Referral placed for colonoscopy.

## 2024-02-14 NOTE — Telephone Encounter (Signed)
 Ok to provide referral to GI- Dr Luis (external referral) for colonoscopy (dx colon cancer screening).  GI will decide if endoscopy is needed

## 2024-02-14 NOTE — Telephone Encounter (Signed)
 Copied from CRM [id number]. Topic: Referral - Request for Referral >> Feb 13, 2024  3:57 PM Taleah C wrote:  Did the patient discuss referral with their provider in the last year? Yes   Appointment offered? Yes  Type of order/referral and detailed reason for visit: Gi for endoscopy & colonoscopy,  Preference of office, provider, location: Doctor Juliane plough at [facility]  [address] [place], [place] tele: [phone]  If referral order, have you been seen by this specialty before? Yes (If Yes, this issue or another issue? When? Where?  Has been a patient there for over 5 years but needs a new referral.   Can we respond through MyChart? sometimes

## 2024-02-14 NOTE — Telephone Encounter (Signed)
 Patient would like a GI referral for endoscopy and colonoscopy. Patient has recent visit in September 2025

## 2024-02-19 NOTE — Telephone Encounter (Signed)
 Patient is asking for another GI provider. Thank you

## 2024-02-19 NOTE — Telephone Encounter (Signed)
 Copied from CRM [id number]. Topic: Referral - Question >> Feb 19, 2024  1:46 PM Dean Hoover wrote:  Reason for CRM: Pt called in wanting to know if he can be referred to a different gastroenterologist. Pt stated that the previous one he was referred to is no longer in office due to him retiring. Pt would like whoever the GI doctor is to give him a call to schedule.

## 2024-02-24 ENCOUNTER — Other Ambulatory Visit: Payer: Self-pay | Admitting: Family Medicine

## 2024-03-07 NOTE — Telephone Encounter (Signed)
 Hey Dr. Mahlon - patient told me that Reyes Plough MD has retired and his office is giving him the run around. He is wanting to be referred to [facility] now.

## 2024-03-07 NOTE — Telephone Encounter (Signed)
 Looking at notes in referral section, I do not see where any updates have been noted. Patient is needing a new GI provider as [facility] is [facility].   I called patient to see if they cannot get him scheduled with another provider. He said that he rather get established with [facility] instead. Dean Hoover is out of office until next week. Patient has been waiting since the beginning of October.[email or web address]

## 2024-03-07 NOTE — Telephone Encounter (Signed)
 Copied from CRM [id number]. Topic: Referral - Question >> Mar 07, 2024  3:10 PM Mercedes MATSU wrote: Patient called in stating that no one has reached out yet to let him know if a new referral order had been sent for a new GI doctor being that the one that was sent prior, the doctor has retired. Patient is requesting a call back at [phone]. Patient has a doctor appt around 4 so if he does not answer to try him again tomorrow.

## 2024-03-08 ENCOUNTER — Other Ambulatory Visit: Payer: Self-pay

## 2024-03-08 DIAGNOSIS — Z1211 Encounter for screening for malignant neoplasm of colon: Secondary | ICD-10-CM

## 2024-03-08 NOTE — Telephone Encounter (Signed)
 I placed the referral. Okay if you sign off while Dr. Mahlon is out

## 2024-05-13 ENCOUNTER — Ambulatory Visit: Payer: Self-pay

## 2024-05-13 NOTE — Telephone Encounter (Signed)
 FYI Only or Action Required?: FYI only for provider: appointment scheduled on 05/14/24.  Patient was last seen in primary care on 01/17/2024 by Mahlon Comer BRAVO, MD.  Called Nurse Triage reporting Back Pain.  Symptoms began chronic x years; worsening for past week.  Interventions attempted: OTC medications:  Salon pas pad, lidocaine  roll on gel, heating pad, ibuprofen.  Symptoms are: gradually worsening.  Triage Disposition: See PCP When Office is Open (Within 3 Days)  Patient/caregiver understands and will follow disposition?: Yes           Copied from CRM [id number]. Topic: Clinical - Red Word Triage >> May 13, 2024 11:12 AM Adelita E wrote: [facility] that prompted transfer to Nurse Triage: Lower back pain, going on for a week, worsened over the weekend. Patient stated the pain ranges from 7-10 out of 10. Reason for Disposition  [1] MODERATE back pain (e.g., interferes with normal activities) AND [2] present > 3 days  Answer Assessment - Initial Assessment Questions 1. ONSET: When did the pain begin? (e.g., minutes, hours, days)     Years ago; worsening the past week/weekend.  2. LOCATION: Where does it hurt? (upper, mid or lower back)     Lower.  3. SEVERITY: How bad is the pain?  (e.g., Scale 1-10; mild, moderate, or severe)     Dull, ache. 7/10.  4. PATTERN: Is the pain constant? (e.g., yes, no; constant, intermittent)      Constant.  5. RADIATION: Does the pain shoot into your legs or somewhere else?     No.  6. CAUSE:  What do you think is causing the back pain?      He states it does not feel like the sciatic pain he has had in the past, not radiating down legs or hips. He states he saw an orthopedist in the past who told him he has arthritis and a slight curvature in his back. He states it feels like nerve pain.  7. BACK OVERUSE:  Any recent lifting of heavy objects, strenuous work or exercise?     No.  8. MEDICINES: What have you taken so  far for the pain? (e.g., nothing, acetaminophen , NSAIDS)     Salon pas pad, lidocaine  roll on gel, heating pad, ibuprofen  9. NEUROLOGIC SYMPTOMS: Do you have any weakness, numbness, or problems with bowel/bladder control?     No.  10. OTHER SYMPTOMS: Do you have any other symptoms? (e.g., fever, abdomen pain, burning with urination, blood in urine)       No abdominal pain, fever, issues with urination.  Protocols used: Back Pain-A-AH

## 2024-05-14 ENCOUNTER — Encounter: Payer: Self-pay | Admitting: Student in an Organized Health Care Education/Training Program

## 2024-05-14 VITALS — BP 131/82 | HR 64 | Wt 217.0 lb

## 2024-05-14 DIAGNOSIS — G8929 Other chronic pain: Secondary | ICD-10-CM | POA: Diagnosis not present

## 2024-05-14 DIAGNOSIS — M545 Low back pain, unspecified: Secondary | ICD-10-CM | POA: Diagnosis not present

## 2024-05-14 MED ORDER — CYCLOBENZAPRINE HCL 5 MG PO TABS: 5.0000 mg | ORAL_TABLET | Freq: Every evening | ORAL | 0 refills | Status: AC | PRN

## 2024-05-14 NOTE — Patient Instructions (Signed)
" °  VISIT SUMMARY: During your visit, we discussed your chronic low back pain, which has recently worsened. You described the pain as severe and persistent, particularly in the mornings, and it has been affecting your sleep and daily activities. We reviewed your history, including previous treatments and recent activities that may have contributed to the exacerbation.  YOUR PLAN: -ACUTE ON CHRONIC LOW BACK PAIN WITH LUMBAR OSTEOARTHRITIS: You have chronic low back pain that has worsened recently, likely due to inflammation in the muscles, tendons, or joints in your lower back. Lumbar osteoarthritis, which is the wear and tear of the joints in your lower back, was confirmed through imaging. We recommend conservative management, including taking ibuprofen 400 mg twice daily for three days and Flexeril  (cyclobenzaprine ) once daily at night to help manage the pain and muscle spasms. Avoid using inversion tables, back braces, and pulse wave therapies. Stay active and avoid prolonged bed rest. If your symptoms do not improve within six weeks, we may consider physical therapy or further imaging.  INSTRUCTIONS: Please follow up with your primary care provider in six to eight weeks to reassess your condition and determine if further treatment is needed.   "

## 2024-05-14 NOTE — Assessment & Plan Note (Signed)
 Chronic low back pain is exacerbated, localized to the lower back with slight left-sided involvement. There are no radicular symptoms or neurological deficits. Imaging shows lumbar osteoarthritis without acute disc herniation or nerve compression. The current episode is severe, impacting daily activities, likely due to inflammation of muscles, tendons, or joints. Conservative management is preferred.  Recommended ibuprofen 400 mg twice daily for three days and prescribed Flexeril  (cyclobenzaprine ) once daily at night. Advise against inversion tables, back braces, and pulse wave therapies. Encouraged staying active and avoiding bed rest. Discussed the potential for physical therapy or further imaging if symptoms persist beyond six weeks. Advised follow-up with the primary care provider in six to eight weeks.

## 2024-05-14 NOTE — Progress Notes (Signed)
 "  Acute Office Visit  Patient ID: Dean Hoover, male    DOB: 1970/10/09, 54 y.o.   MRN: [id number]  PCP: Mahlon Comer BRAVO, MD  Chief Complaint  Patient presents with   Back Pain    Lower back pain, going on for a week, worsened over the weekend.     Subjective:     HPI  Discussed the use of AI scribe software for clinical note transcription with the patient, who gave verbal consent to proceed.  History of Present Illness Dean Hoover is a 54 year old male with chronic low back pain who presents with an acute exacerbation of back pain.  He has experienced chronic low back pain for several years and was previously evaluated by an orthopedic specialist, who told him he had arthritis and a slight curvature of the spine. Over the past week, there has been a significant increase in pain severity, described as 'really sore' and 'very uncomfortable all the time'. The pain is most severe in the mornings and persists throughout the day, impacting his ability to sleep comfortably.  He has attempted various treatments including Salonpas pads, lidocaine  roll-on, ibuprofen, and a heating pad, none of which provided relief. Recently, he tried a CBD freeze spray, which offered some relief, particularly in the afternoon and evening. He continues to experience discomfort, especially when trying to sleep.  The pain is localized to the lower back, slightly to the left, without radiation down the legs during this episode. He occasionally experiences sciatica on the right side, but not currently. There have been no recent falls, traumas, or car accidents. No fevers, chills, or significant changes in his overall health.  He works as a designer, multimedia, which requires mobility and occasionally involves physical activities such as climbing and carrying items. The pain has not significantly hindered his work, although he left work early recently due to discomfort. He also mentions a recent activity  involving prolonged standing on a concrete floor, which may have exacerbated his symptoms.      Objective:    BP 131/82   Pulse 64   Wt 217 lb (98.4 kg)   SpO2 99%   BMI 29.43 kg/m   Physical Exam  Gen: Well-appearing man Neuro: Alert, conversational, full strength upper and lower extremities, normal get up and go, gait is nonantalgic, normal patellar reflexes bilaterally Back: Mild discomfort at the mid lumbar spine, no point tenderness over the spinous processes, no ecchymoses Ext: Warm, no edema, normal muscle bulk and tone     Assessment & Plan:   Problem List Items Addressed This Visit       Unprioritized   Acute on chronic low back pain - Primary   Chronic low back pain is exacerbated, localized to the lower back with slight left-sided involvement. There are no radicular symptoms or neurological deficits. Imaging shows lumbar osteoarthritis without acute disc herniation or nerve compression. The current episode is severe, impacting daily activities, likely due to inflammation of muscles, tendons, or joints. Conservative management is preferred.  Recommended ibuprofen 400 mg twice daily for three days and prescribed Flexeril  (cyclobenzaprine ) once daily at night. Advise against inversion tables, back braces, and pulse wave therapies. Encouraged staying active and avoiding bed rest. Discussed the potential for physical therapy or further imaging if symptoms persist beyond six weeks. Advised follow-up with the primary care provider in six to eight weeks.      Relevant Medications   cyclobenzaprine  (FLEXERIL ) 5 MG tablet  Meds ordered this encounter  Medications   cyclobenzaprine  (FLEXERIL ) 5 MG tablet    Sig: Take 1 tablet (5 mg total) by mouth at bedtime as needed for muscle spasms.    Dispense:  30 tablet    Refill:  0    Return if symptoms worsen or fail to improve.  Cleatus Debby Specking, MD [facility]   "

## 2024-05-21 ENCOUNTER — Ambulatory Visit: Admitting: Family Medicine

## 2024-05-21 ENCOUNTER — Encounter: Payer: Self-pay | Admitting: Family Medicine

## 2024-05-21 VITALS — BP 110/74 | HR 72 | Temp 98.2°F | Resp 15 | Ht 72.0 in | Wt 215.0 lb

## 2024-05-21 DIAGNOSIS — R10A2 Flank pain, left side: Secondary | ICD-10-CM

## 2024-05-21 DIAGNOSIS — R1030 Lower abdominal pain, unspecified: Secondary | ICD-10-CM

## 2024-05-21 LAB — CBC WITH DIFFERENTIAL/PLATELET
Basophils Absolute: 0 K/uL (ref 0.0–0.1)
Basophils Relative: 0.5 % (ref 0.0–3.0)
Eosinophils Absolute: 0.2 K/uL (ref 0.0–0.7)
Eosinophils Relative: 3.3 % (ref 0.0–5.0)
HCT: 43.8 % (ref 39.0–52.0)
Hemoglobin: 14.7 g/dL (ref 13.0–17.0)
Lymphocytes Relative: 41.7 % (ref 12.0–46.0)
Lymphs Abs: 2.4 K/uL (ref 0.7–4.0)
MCHC: 33.7 g/dL (ref 30.0–36.0)
MCV: 95.8 fl (ref 78.0–100.0)
Monocytes Absolute: 0.6 K/uL (ref 0.1–1.0)
Monocytes Relative: 10.2 % (ref 3.0–12.0)
Neutro Abs: 2.6 K/uL (ref 1.4–7.7)
Neutrophils Relative %: 44.3 % (ref 43.0–77.0)
Platelets: 214 K/uL (ref 150.0–400.0)
RBC: 4.57 Mil/uL (ref 4.22–5.81)
RDW: 13.7 % (ref 11.5–15.5)
WBC: 5.9 K/uL (ref 4.0–10.5)

## 2024-05-21 LAB — POCT URINALYSIS DIPSTICK
Appearance: NORMAL
Bilirubin, UA: NEGATIVE
Blood, UA: NEGATIVE
Glucose, UA: NEGATIVE
Ketones, UA: NEGATIVE
Leukocytes, UA: NEGATIVE
Nitrite, UA: NEGATIVE
Protein, UA: NEGATIVE
Spec Grav, UA: 1.015
Urobilinogen, UA: 0.2 U/dL
pH, UA: 8

## 2024-05-21 LAB — HEPATIC FUNCTION PANEL
ALT: 22 U/L (ref 3–53)
AST: 18 U/L (ref 5–37)
Albumin: 4.4 g/dL (ref 3.5–5.2)
Alkaline Phosphatase: 50 U/L (ref 39–117)
Bilirubin, Direct: 0.1 mg/dL (ref 0.1–0.3)
Total Bilirubin: 0.4 mg/dL (ref 0.2–1.2)
Total Protein: 7.5 g/dL (ref 6.0–8.3)

## 2024-05-21 LAB — BASIC METABOLIC PANEL WITH GFR
BUN: 9 mg/dL (ref 6–23)
CO2: 29 meq/L (ref 19–32)
Calcium: 9.4 mg/dL (ref 8.4–10.5)
Chloride: 105 meq/L (ref 96–112)
Creatinine, Ser: 1.02 mg/dL (ref 0.40–1.50)
GFR: 84.9 mL/min
Glucose, Bld: 106 mg/dL — ABNORMAL HIGH (ref 70–99)
Potassium: 4 meq/L (ref 3.5–5.1)
Sodium: 139 meq/L (ref 135–145)

## 2024-05-21 LAB — LIPASE: Lipase: 23 U/L (ref 11.0–59.0)

## 2024-05-21 MED ORDER — PREDNISONE 10 MG PO TABS: ORAL_TABLET | ORAL | 0 refills | Status: DC

## 2024-05-21 NOTE — Patient Instructions (Signed)
 Follow up as needed or as scheduled We'll notify you of your lab results and make any changes if needed START the Prednisone  as directed- 3 pills at the same time x3 days, then 2 pills at the same time x3 days, then 1 pill daily.  Take w/ food  Try heat on your back for pain relief KEEP ME UPDATED! Call with any questions or concerns Hang in there!

## 2024-05-21 NOTE — Progress Notes (Signed)
" ° °  Subjective:    Patient ID: Dean Hoover, male    DOB: 06/18/1970, 54 y.o.   MRN: [id number]  HPI L abd pain- sxs started last week w/ a 'slight cramp' in the LLQ.  Sxs are 'mostly ok' throughout the day but worsen w/ lying down.  Unable to lie on L side.  Still has discomfort when lying on back or R side but not as severe.  No TTP, no redness, lump.  Doesn't feel muscular, 'feels more internal'.  No changes to bowel or bladder habits.  Pain is localized to L flank- rib to hip.  No N/V.     Review of Systems For ROS see HPI     Objective:   Physical Exam Vitals reviewed.  Constitutional:      General: He is not in acute distress.    Appearance: He is well-developed. He is not ill-appearing.  HENT:     Head: Normocephalic and atraumatic.  Eyes:     Extraocular Movements: Extraocular movements intact.     Conjunctiva/sclera: Conjunctivae normal.  Cardiovascular:     Rate and Rhythm: Normal rate and regular rhythm.  Pulmonary:     Effort: Pulmonary effort is normal. No respiratory distress.  Abdominal:     General: There is no distension.     Palpations: Abdomen is soft. There is no mass.     Tenderness: There is no abdominal tenderness. There is no right CVA tenderness, left CVA tenderness, guarding or rebound.     Hernia: No hernia is present.  Musculoskeletal:     Cervical back: Normal range of motion and neck supple.     Comments: L flank pain when lying flat but not at an incline  Skin:    General: Skin is warm and dry.     Findings: No bruising, erythema or rash.  Neurological:     General: No focal deficit present.     Mental Status: He is alert and oriented to person, place, and time.  Psychiatric:        Mood and Affect: Mood normal.        Behavior: Behavior normal.        Thought Content: Thought content normal.           Assessment & Plan:  Flank pain- new.  Pt was seen last week for back pain.  Now w/ abdominal pain that is not reproducible by  palpation of area.  No pain w/ sitting upright or lying at an incline.  + pain when lying flat.  Suspect this is related to recent back pain.  Check labs to r/o abd issues or infxn.  Start Prednisone  taper for inflammation.  Will follow.  "

## 2024-05-22 ENCOUNTER — Ambulatory Visit: Payer: Self-pay | Admitting: Family Medicine

## 2024-05-28 ENCOUNTER — Ambulatory Visit
Admission: RE | Admit: 2024-05-28 | Discharge: 2024-05-28 | Disposition: A | Discharge status: Home / Self Care | Source: Ambulatory Visit | Attending: Family Medicine | Admitting: Family Medicine

## 2024-05-28 ENCOUNTER — Ambulatory Visit: Payer: Self-pay | Admitting: Family Medicine

## 2024-05-28 ENCOUNTER — Ambulatory Visit: Payer: Self-pay

## 2024-05-28 DIAGNOSIS — R1032 Left lower quadrant pain: Secondary | ICD-10-CM | POA: Diagnosis present

## 2024-05-28 MED ORDER — IOHEXOL 300 MG/ML  SOLN
100.0000 mL | Freq: Once | INTRAMUSCULAR | Status: AC | PRN
  Administered 2024-05-28: 100 mL via INTRAVENOUS

## 2024-05-28 NOTE — Addendum Note (Signed)
 Addended by: Ryott Rafferty E on: 05/28/2024 11:20 AM   Modules accepted: Orders

## 2024-05-28 NOTE — Telephone Encounter (Signed)
 Patient is aware. He will wait for phone call.

## 2024-05-28 NOTE — Telephone Encounter (Signed)
 Pt reports no improvement in abdominal pain, states back pain is improving. States abdominal pain is dull, intermittent and worse when laying on his left side, rated unnoticeable to 8/10. Pain is left sided. Last bowel movement was this AM and was normal in color. Denies dysuria, fever, distention or N/V.

## 2024-05-28 NOTE — Telephone Encounter (Signed)
 Patient asking if provider can order imaging vs another OV d/t no change in symptoms since visit on 05/21/24. Please call (not via MyChart).   FYI Only or Action Required?: Action required by provider: clinical question for provider.  Patient was last seen in primary care on 05/21/2024 by Mahlon Comer BRAVO, MD.  Called Nurse Triage reporting Abdominal Pain.  Symptoms began 05/14/24.  Interventions attempted: Other: prednisone .  Symptoms are: unchanged.  Triage Disposition: Discuss With PCP and Callback by Nurse Today (overriding See Physician Within 24 Hours)  Patient/caregiver understands and will follow disposition?: Yes Reason for Disposition  [1] MODERATE pain (e.g., interferes with normal activities) AND [2] pain comes and goes (cramps) AND [3] present > 24 hours  (Exception: Pain with Vomiting or Diarrhea - see that Guideline.)  Answer Assessment - Initial Assessment Questions Pt reports no improvement in abdominal pain, states back pain is improving. States abdominal pain is dull, intermittent and worse when laying on his left side, rated unnoticeable to 8/10. Pain is left sided. Last bowel movement was this AM and was normal in color. Denies dysuria, fever, distention or N/V.   Note: Pt was seen on 05/14/24 for chronic back pain and on 05/21/24 for abdominal pain when laying flat that provider noted could be r/t his back pain. Per provider notes, labs unremarkable. Pt tx with prednisone .  1. LOCATION: Where does it hurt?      Mostly LLQ 2. RADIATION: Does the pain shoot anywhere else? (e.g., chest, back)     Denies 3. ONSET: When did the pain begin? (Minutes, hours or days ago)      05/14/24 4. SUDDEN: Gradual or sudden onset?     Gradual, started as mild cramping 5. PATTERN Does the pain come and go, or is it constant?     Intermittent 6. SEVERITY: How bad is the pain?  (e.g., Scale 1-10; mild, moderate, or severe)     From 0-8/10 8. CAUSE: What do you think is  causing the stomach pain? (e.g., gallstones, recent abdominal surgery)     Denies 9. RELIEVING/AGGRAVATING FACTORS: What makes it better or worse? (e.g., antacids, bending or twisting motion, bowel movement)     Laying on left side 10. OTHER SYMPTOMS: Do you have any other symptoms? (e.g., back pain, diarrhea, fever, urination pain, vomiting)       Denies, states back pain improving  Protocols used: Abdominal Pain - Male-A-AH  Message from Ahlexyia S sent at 05/28/2024 10:09 AM EST  Reason for Triage: Pt called in stating that he has been having abdominal pain. Pt was seen in office on 1/13 and was prescribed some medication but the issue is still ongoing. Pt is unsure of what to do and is requesting a callback regarding this. Pt has denied having any other symptoms at this time.

## 2024-05-28 NOTE — Telephone Encounter (Signed)
 Due to ongoing- and seemingly worsening pain (up to 8/10)- will order CT scan of abd and pelvis to assess.  I put this in as a stat order and they should call you to schedule.

## 2024-05-29 ENCOUNTER — Encounter: Payer: Self-pay | Admitting: Internal Medicine

## 2024-05-29 ENCOUNTER — Ambulatory Visit: Admitting: Internal Medicine

## 2024-05-29 VITALS — BP 124/78 | HR 81 | Ht 72.0 in | Wt 217.0 lb

## 2024-05-29 DIAGNOSIS — Z860100 Personal history of colon polyps, unspecified: Secondary | ICD-10-CM

## 2024-05-29 DIAGNOSIS — K200 Eosinophilic esophagitis: Secondary | ICD-10-CM | POA: Diagnosis not present

## 2024-05-29 DIAGNOSIS — K219 Gastro-esophageal reflux disease without esophagitis: Secondary | ICD-10-CM

## 2024-05-29 DIAGNOSIS — R1032 Left lower quadrant pain: Secondary | ICD-10-CM

## 2024-05-29 DIAGNOSIS — Z860101 Personal history of adenomatous and serrated colon polyps: Secondary | ICD-10-CM | POA: Diagnosis not present

## 2024-05-29 DIAGNOSIS — Z8719 Personal history of other diseases of the digestive system: Secondary | ICD-10-CM | POA: Diagnosis not present

## 2024-05-29 DIAGNOSIS — R1310 Dysphagia, unspecified: Secondary | ICD-10-CM

## 2024-05-29 MED ORDER — NA SULFATE-K SULFATE-MG SULF 17.5-3.13-1.6 GM/177ML PO SOLN: 1.0000 | Freq: Once | ORAL | 0 refills | Status: AC

## 2024-05-29 NOTE — Patient Instructions (Addendum)
 You have been scheduled for an endoscopy and colonoscopy with Dr. Federico. Please follow the written instructions given to you at your visit today.  If you use inhalers (even only as needed), please bring them with you on the day of your procedure.  DO NOT TAKE 7 DAYS PRIOR TO TEST- Trulicity (dulaglutide) Ozempic, Wegovy (semaglutide) Mounjaro, Zepbound (tirzepatide) Bydureon Bcise (exanatide extended release)  DO NOT TAKE 1 DAY PRIOR TO YOUR TEST Rybelsus (semaglutide) Adlyxin (lixisenatide) Victoza (liraglutide) Byetta (exanatide) ___________________________________________________________________________   We will request your colonoscopy and endoscopy records from Dr. Marshell office.  Thank you for entrusting me with your care and for choosing [facility], Dr. Estefana Federico   _______________________________________________________  If your blood pressure at your visit was 140/90 or greater, please contact your primary care physician to follow up on this.  _______________________________________________________  If you are age 40 or older, your body mass index should be between 23-30. Your Body mass index is 29.43 kg/m. If this is out of the aforementioned range listed, please consider follow up with your Primary Care Provider.  If you are age 8 or younger, your body mass index should be between 19-25. Your Body mass index is 29.43 kg/m. If this is out of the aformentioned range listed, please consider follow up with your Primary Care Provider.   ________________________________________________________  The [facility] providers would like to encourage you to use MYCHART to communicate with providers for non-urgent requests or questions.  Due to long hold times on the telephone, sending your provider a message by [facility] may be a faster and more efficient way to get a response.  Please allow 48 business hours for a response.  Please remember that this is for non-urgent  requests.  _______________________________________________________  [facility] is using a team-based approach to care.  Your team is made up of your doctor and two to three APPS. Our APPS (Nurse Practitioners and Physician Assistants) work with your physician to ensure care continuity for you. They are fully qualified to address your health concerns and develop a treatment plan. They communicate directly with your gastroenterologist to care for you. Seeing the Advanced Practice Practitioners on your physician's team can help you by facilitating care more promptly, often allowing for earlier appointments, access to diagnostic testing, procedures, and other specialty referrals.

## 2024-05-29 NOTE — Progress Notes (Signed)
 Patient responded to results

## 2024-05-29 NOTE — Progress Notes (Signed)
 "  Chief Complaint: Dysphagia, history of colon polyps  HPI : 54 year old male with history of EoE, GERD, perianal fistula, and nephrolithiasis presents with dysphagia and history of colon polyps  His last colonoscopy showed two colon polyps in 2018. His last EGDs were in 2018 and at that time he required esophageal dilation over the course of a few sessions. He is currently on Pepcid for EoE and GERD. He was on omeprazole in the past and it seemed to cause stomach upset. He did buy some OTC Prilosec today to try because he currently doesn't think that the Pepcid is quite strong enough. He didn't have any issues with dysphagia except over the last year again. Dysphagia is only to solids, not liquids. The dysphagia has been slightly more frequent over time. Denies N&V. Endorses chest burning. Endorses very occasional regurgitation. He has LLQ ab pain that has appeared over the last few weeks, which his PCP has attributed to MSK related pain. Denies blood in the stools. Denies diarrhea or constipation. Grandfather had colon cancer.   Past Medical History:  Diagnosis Date   Elevated cholesterol    Esophagitis, eosinophilic    Fistula, anal    GERD (gastroesophageal reflux disease)    occ   Hearing loss in right ear    wears hearing aid   History of dysphagia    Kidney stone    hx of   Patella fracture    left knee   PONV (postoperative nausea and vomiting)    Skin cancer    Status post dilation of esophageal narrowing      Past Surgical History:  Procedure Laterality Date   BONE ANCHORED HEARING AID IMPLANT  07/14/2011   Procedure: BONE ANCHORED HEARING AID (BAHA) IMPLANT;  Surgeon: Camellia CHRISTELLA Milliner, MD;  Location: [facility] OR;  Service: ENT;  Laterality: Right;   COLONOSCOPY     esophageal dilation     LITHOTRIPSY     MIDDLE EAR SURGERY  04/12/2006   tumor removed, prosthestic hearing bones insert,   SHOULDER ARTHROSCOPY     torn cuff lft   SHOULDER ARTHROTOMY     torn lig lft   skin  cancer removal  11/2023   TONSILLECTOMY     54 yrs old   Family History  Problem Relation Age of Onset   Multiple sclerosis Mother    High blood pressure Mother    COPD Father    Diabetes Father    Emphysema Father    Cancer - Colon Maternal Grandfather    Social History[1] Current Outpatient Medications  Medication Sig Dispense Refill   atorvastatin  (LIPITOR) 20 MG tablet TAKE 1 TABLET BY MOUTH EVERY DAY 30 tablet 5   calcium  carbonate (TUMS - DOSED IN MG ELEMENTAL CALCIUM ) 500 MG chewable tablet Chew 1 tablet by mouth as needed for indigestion or heartburn.     cyclobenzaprine  (FLEXERIL ) 5 MG tablet Take 1 tablet (5 mg total) by mouth at bedtime as needed for muscle spasms. 30 tablet 0   dicyclomine  (BENTYL ) 20 MG tablet Take 1 tablet (20 mg total) by mouth 3 (three) times daily as needed for spasms. 45 tablet 0   famotidine (PEPCID) 10 MG tablet Take 10 mg by mouth 2 (two) times daily.     No current facility-administered medications for this visit.   Allergies[2]   Review of Systems: All systems reviewed and negative except where noted in HPI.   Physical Exam: BP 124/78   Pulse 81  Ht 6' (1.829 m)   Wt 217 lb (98.4 kg)   BMI 29.43 kg/m  Constitutional: Pleasant,well-developed, male in no acute distress. HEENT: Normocephalic and atraumatic. Conjunctivae are normal. No scleral icterus. Cardiovascular: Normal rate, regular rhythm.  Pulmonary/chest: Effort normal and breath sounds normal. No wheezing, rales or rhonchi. Abdominal: Soft, nondistended, nontender. Bowel sounds active throughout. There are no masses palpable. No hepatomegaly. Extremities: No edema Neurological: Alert and oriented to person place and time. Skin: Skin is warm and dry. No rashes noted. Psychiatric: Normal mood and affect. Behavior is normal.  Labs 01/2019: TTG IgA negative. IgA nml.   Labs 01/2024: TSH nml.   Labs 05/2024: CMP nml. CBC nml. Lipase nml.   MR Pelvis w/contrast  05/04/16: IMPRESSION: 1. Trans-sphincteric 6 o'clock perianal fistula with thick enhancing wall and internal pus extending to the skin surface in the midline natal cleft region, as described. 2. Separate short left intersphincteric space fistula tract coursing superiorly from the 6 o'clock perianal fistula. 3. No focal drainable abscess.  CT A/P w/contrast 05/28/24: IMPRESSION: 1. Sigmoid diverticulosis without inflammation. 2. No acute abnormality seen in the abdomen or pelvis  EGD 02/03/17: No endoscopy report available Path: A.  DISTAL ESOPHAGUS, BIOPSY:       Reactive squamous mucosa with greater than 50 eosinophils  per high power field, consistent with            eosinophilic esophagitis.  B.  MID ESOPHAGUS, BIOPSY:       Reactive squamous mucosa with 40-50 eosinophils per high  power field, consistent with eosinophilic            esophagitis.  C.  PROXIMAL ESOPHAGUS, BIOPSY:       Reactive squamous mucosa with greater than 50 eosinophils  per high power field, consistent with            eosinophilic esophagitis.   EGD/colonoscopy 01/23/17: EGD 01/2017 - eosinophilic esophagitis, balloon dilate 15 mm  A.  DISTAL ESOPHAGUS, BIOPSY:       Active esophagitis with prominent intraepithelial eosinophil  infiltration (> 50 / high power field).       See note (A-C).  B.  MID ESOPHAGUS, BIOPSY:        Active esophagitis with prominent intraepithelial  eosinophil infiltration (> 50 / high power field).       See note (A-C).  C.  PROXIMAL ESOPHAGUS, BIOPSY:        Active esophagitis with prominent intraepithelial  eosinophil infiltration (up to 45 / high power field).       See note (A-C).  Note (A-C): The findings are suggestive of allergic  (eosinophilic) esophagitis in the appropriate clinical setting.  D.  CECAL COLON POLYPS, POLYPECTOMY:       Tubular adenoma       Separate fragments of colonic mucosa shows marked active  colitis with crypt abscess (see note D).    ASSESSMENT AND PLAN: Dysphagia GERD EoE LLQ ab pain History of colon polyps History of perianal fistula Patient presents with dysphagia that has recurred over the last year.  He has a longstanding history of EOE for which he has really only been on Pepcid therapy.  Previously he was on omeprazole but was taken off of this due to concerns about diarrhea.  More recently he has also had issues with uncontrolled reflux.  It is likely that patient will need to be on long-term PPI therapy to keep his EOE under better control.  He has history of presumed  esophageal strictures due to EOE and has required for repeated esophageal dilations in the past.  Will plan to get him scheduled for an EGD to evaluate the status of his EOE and dysphagia, and we will also get him scheduled for colonoscopy since he is due for polyp surveillance.  His last colonoscopy was in at least 2 small tubular adenomas at that time.  Interestingly he was noted to have some active colitis with crypt abscess on a prior polypectomy sample, which could potentially suggest some underlying early IBD.  He also has a history of perianal fistula, which may fit with this picture. He has recently developed some LLQ ab pain, would want to rule out SCAD or IBD as a contributor to this ab pain. - Okay to take omeprazole 20 mg daily - EGD/colonoscopy [facility]  Estefana Kidney, MD  I spent 50 minutes of time, including in depth chart review, independent review of results as outlined above, communicating results with the patient directly, face-to-face time with the patient, coordinating care, ordering studies and medications as appropriate, and documentation.       [1]  Social History Tobacco Use   Smoking status: Former   Smokeless tobacco: Current    Types: Snuff   Tobacco comments:    smoke once in while; i can snuff/week  Vaping Use   Vaping status: Never Used  Substance Use Topics   Alcohol use: Yes    Alcohol/week: 7.0 standard drinks  of alcohol    Types: 7 Cans of beer per week   Drug use: Never  [2]  Allergies Allergen Reactions   Fish Allergy Anaphylaxis    Throat swells, Only with Tuna   Poultry Meal Anaphylaxis    Throat swells   Tree Extract Hives and Swelling    Tree nuts   "

## 2024-07-17 ENCOUNTER — Encounter: Admitting: Family Medicine

## 2024-07-31 ENCOUNTER — Encounter: Admitting: Internal Medicine
